# Patient Record
Sex: Female | Born: 1965 | Race: White | Hispanic: No | Marital: Married | Smoking: Never smoker
Health system: Southern US, Community
[De-identification: ages and names within clinical notes are randomized; demographics above are authoritative.]

## PROBLEM LIST (undated history)

## (undated) DIAGNOSIS — D259 Leiomyoma of uterus, unspecified: Secondary | ICD-10-CM

## (undated) DIAGNOSIS — O3410 Maternal care for benign tumor of corpus uteri, unspecified trimester: Secondary | ICD-10-CM

## (undated) DIAGNOSIS — R011 Cardiac murmur, unspecified: Secondary | ICD-10-CM

## (undated) DIAGNOSIS — G43909 Migraine, unspecified, not intractable, without status migrainosus: Secondary | ICD-10-CM

## (undated) HISTORY — PX: ABDOMINAL HYSTERECTOMY: SHX81

## (undated) HISTORY — DX: Leiomyoma of uterus, unspecified: O34.10

## (undated) HISTORY — DX: Migraine, unspecified, not intractable, without status migrainosus: G43.909

## (undated) HISTORY — DX: Leiomyoma of uterus, unspecified: D25.9

## (undated) HISTORY — PX: OTHER SURGICAL HISTORY: SHX169

## (undated) HISTORY — DX: Cardiac murmur, unspecified: R01.1

---

## 2004-05-23 HISTORY — PX: DOPPLER ECHOCARDIOGRAPHY: SHX263

## 2005-08-02 ENCOUNTER — Ambulatory Visit: Payer: Self-pay

## 2008-07-28 ENCOUNTER — Ambulatory Visit: Payer: Self-pay

## 2008-09-05 ENCOUNTER — Ambulatory Visit: Payer: Self-pay | Admitting: Obstetrics and Gynecology

## 2008-09-09 ENCOUNTER — Ambulatory Visit: Payer: Self-pay | Admitting: Obstetrics and Gynecology

## 2008-12-26 ENCOUNTER — Ambulatory Visit: Payer: Self-pay | Admitting: Obstetrics and Gynecology

## 2009-01-13 ENCOUNTER — Ambulatory Visit: Payer: Self-pay | Admitting: Obstetrics and Gynecology

## 2010-04-14 ENCOUNTER — Ambulatory Visit: Payer: Self-pay | Admitting: Obstetrics and Gynecology

## 2011-08-11 ENCOUNTER — Ambulatory Visit: Payer: Self-pay | Admitting: Family Medicine

## 2012-09-11 LAB — HM PAP SMEAR

## 2012-09-27 ENCOUNTER — Ambulatory Visit: Payer: Self-pay | Admitting: Family Medicine

## 2014-01-20 ENCOUNTER — Ambulatory Visit: Payer: Self-pay | Admitting: Family Medicine

## 2014-01-20 LAB — HM MAMMOGRAPHY

## 2015-03-06 ENCOUNTER — Other Ambulatory Visit: Payer: Self-pay | Admitting: Family Medicine

## 2015-03-06 DIAGNOSIS — Z1231 Encounter for screening mammogram for malignant neoplasm of breast: Secondary | ICD-10-CM

## 2015-03-09 ENCOUNTER — Ambulatory Visit: Payer: Self-pay

## 2015-03-16 ENCOUNTER — Ambulatory Visit
Admission: RE | Admit: 2015-03-16 | Discharge: 2015-03-16 | Disposition: A | Discharge status: Home / Self Care | Payer: BC Managed Care – PPO | Source: Ambulatory Visit | Attending: Family Medicine | Admitting: Family Medicine

## 2015-03-16 DIAGNOSIS — Z1231 Encounter for screening mammogram for malignant neoplasm of breast: Secondary | ICD-10-CM | POA: Insufficient documentation

## 2015-03-17 DIAGNOSIS — G43909 Migraine, unspecified, not intractable, without status migrainosus: Secondary | ICD-10-CM | POA: Insufficient documentation

## 2015-03-17 DIAGNOSIS — F321 Major depressive disorder, single episode, moderate: Secondary | ICD-10-CM

## 2015-03-17 DIAGNOSIS — R011 Cardiac murmur, unspecified: Secondary | ICD-10-CM | POA: Insufficient documentation

## 2015-03-19 ENCOUNTER — Encounter: Payer: Self-pay | Admitting: Family Medicine

## 2015-03-19 ENCOUNTER — Ambulatory Visit (INDEPENDENT_AMBULATORY_CARE_PROVIDER_SITE_OTHER): Payer: BC Managed Care – PPO | Admitting: Family Medicine

## 2015-03-19 VITALS — BP 117/78 | HR 57 | Temp 97.8°F | Ht 63.0 in | Wt 147.2 lb

## 2015-03-19 DIAGNOSIS — Z23 Encounter for immunization: Secondary | ICD-10-CM | POA: Diagnosis not present

## 2015-03-19 DIAGNOSIS — Z0000 Encounter for general adult medical examination without abnormal findings: Secondary | ICD-10-CM

## 2015-03-19 DIAGNOSIS — Z90711 Acquired absence of uterus with remaining cervical stump: Secondary | ICD-10-CM | POA: Diagnosis not present

## 2015-03-19 LAB — URINALYSIS, ROUTINE W REFLEX MICROSCOPIC
BILIRUBIN UA: NEGATIVE
Glucose, UA: NEGATIVE
KETONES UA: NEGATIVE
Leukocytes, UA: NEGATIVE
Nitrite, UA: NEGATIVE
PH UA: 7 (ref 5.0–7.5)
Protein, UA: NEGATIVE
SPEC GRAV UA: 1.015 (ref 1.005–1.030)
UUROB: 0.2 mg/dL (ref 0.2–1.0)

## 2015-03-19 LAB — MICROSCOPIC EXAMINATION
RBC, UA: NONE SEEN /hpf (ref 0–?)
WBC UA: NONE SEEN /HPF (ref 0–?)

## 2015-03-19 NOTE — Progress Notes (Signed)
BP 117/78 mmHg  Pulse 57  Temp(Src) 97.8 F (36.6 C)  Ht 5\' 3"  (1.6 m)  Wt 147 lb 3.2 oz (66.769 kg)  BMI 26.08 kg/m2  SpO2 99%   Subjective:    Patient ID: Cheryl Dawson, female    DOB: 06-20-1965, 49 y.o.   MRN: [id number]  HPI: Cheryl Dawson is a 49 y.o. female  Chief Complaint  Patient presents with  . 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 Interim medical history since our last visit reviewed. Allergies and medications reviewed and updated.  Review of Systems  Constitutional: Negative.   HENT: Negative.   Eyes: Negative.   Respiratory: Negative.   Cardiovascular: Negative.   Gastrointestinal: Negative.   Endocrine: Negative.   Genitourinary: Negative.   Musculoskeletal: Negative.   Skin: Negative.   Allergic/Immunologic: Negative.   Neurological: Negative.   Hematological: Negative.   Psychiatric/Behavioral: Negative.     Per HPI unless specifically indicated above     Objective:    BP 117/78 mmHg  Pulse 57  Temp(Src) 97.8 F (36.6 C)  Ht 5\' 3"  (1.6 m)  Wt 147 lb 3.2 oz (66.769 kg)  BMI 26.08 kg/m2  SpO2 99%  Wt Readings from Last 3 Encounters:  03/19/15 147 lb 3.2 oz (66.769 kg)  01/17/14 148 lb (67.132 kg)    Physical Exam  Constitutional: She is oriented to person, place, and time. She appears well-developed and well-nourished. No distress.  HENT:  Head: Normocephalic and atraumatic.  Right Ear: Hearing and external ear normal.  Left Ear: Hearing and external ear normal.  Nose: Nose normal.  Mouth/Throat: Oropharynx is clear and moist.  Eyes: Conjunctivae, EOM and lids are normal. Pupils are equal, round, and reactive to light. Right eye exhibits no discharge. Left eye exhibits no discharge. No scleral icterus.   Neck: Normal range of motion. Neck supple. Carotid bruit is not present.  Cardiovascular: Normal rate, regular rhythm and normal heart sounds.   No murmur heard. Pulmonary/Chest: Effort normal and breath sounds normal. No respiratory distress. She exhibits no mass. Right breast exhibits no mass, no skin change and no tenderness. Left breast exhibits no mass, no skin change and no tenderness. Breasts are symmetrical.  Abdominal: Soft. Bowel sounds are normal. There is no hepatosplenomegaly.  Musculoskeletal: Normal range of motion.  Neurological: She is alert and oriented to person, place, and time.  Skin: Skin is intact. No rash noted.  Developing Dupuytren's contractures  Psychiatric: She has a normal mood and affect. Her speech is normal and behavior is normal. Judgment and thought content normal. Cognition and memory are normal.    Results for orders placed or performed in visit on 03/17/15  HM MAMMOGRAPHY  Result Value Ref Range   HM Mammogram from PP   HM PAP SMEAR  Result Value Ref Range   HM Pap smear from PP       Assessment & Plan:   Problem List Items Addressed This Visit      Other   S/P partial hysterectomy    Other Visit Diagnoses    Routine general medical examination at a health care facility    -  Primary    Relevant Orders    CBC with Differential/Platelet    Comprehensive metabolic panel  Lipid Panel w/o Chol/HDL Ratio    TSH    Urinalysis, Routine w reflex microscopic (not at [facility])    Immunization due        Relevant Orders    Flu Vaccine QUAD 36+ mos PF IM (Fluarix & Fluzone Quad PF) (Completed)        Follow up plan: Return if symptoms worsen or fail to improve, for Physical Exam.

## 2015-03-20 LAB — CBC WITH DIFFERENTIAL/PLATELET
BASOS ABS: 0 10*3/uL (ref 0.0–0.2)
Basos: 1 %
EOS (ABSOLUTE): 0.1 10*3/uL (ref 0.0–0.4)
EOS: 1 %
HEMATOCRIT: 41.7 % (ref 34.0–46.6)
HEMOGLOBIN: 14 g/dL (ref 11.1–15.9)
IMMATURE GRANS (ABS): 0 10*3/uL (ref 0.0–0.1)
Immature Granulocytes: 0 %
LYMPHS: 44 %
Lymphocytes Absolute: 1.9 10*3/uL (ref 0.7–3.1)
MCH: 28.9 pg (ref 26.6–33.0)
MCHC: 33.6 g/dL (ref 31.5–35.7)
MCV: 86 fL (ref 79–97)
MONOCYTES: 7 %
Monocytes Absolute: 0.3 10*3/uL (ref 0.1–0.9)
NEUTROS ABS: 2 10*3/uL (ref 1.4–7.0)
Neutrophils: 47 %
Platelets: 248 10*3/uL (ref 150–379)
RBC: 4.85 x10E6/uL (ref 3.77–5.28)
RDW: 13.5 % (ref 12.3–15.4)
WBC: 4.3 10*3/uL (ref 3.4–10.8)

## 2015-03-20 LAB — LIPID PANEL W/O CHOL/HDL RATIO
Cholesterol, Total: 129 mg/dL (ref 100–199)
HDL: 55 mg/dL (ref >39)
LDL CALC: 62 mg/dL (ref 0–99)
Triglycerides: 58 mg/dL (ref 0–149)
VLDL CHOLESTEROL CAL: 12 mg/dL (ref 5–40)

## 2015-03-20 LAB — COMPREHENSIVE METABOLIC PANEL
ALT: 13 IU/L (ref 0–32)
AST: 12 IU/L (ref 0–40)
Albumin/Globulin Ratio: 1.9 (ref 1.1–2.5)
Albumin: 4.4 g/dL (ref 3.5–5.5)
Alkaline Phosphatase: 55 IU/L (ref 39–117)
BUN / CREAT RATIO: 19 (ref 9–23)
BUN: 14 mg/dL (ref 6–24)
Bilirubin Total: 0.3 mg/dL (ref 0.0–1.2)
CO2: 27 mmol/L (ref 18–29)
CREATININE: 0.74 mg/dL (ref 0.57–1.00)
Calcium: 9.4 mg/dL (ref 8.7–10.2)
Chloride: 100 mmol/L (ref 97–106)
GFR calc non Af Amer: 97 mL/min/{1.73_m2} (ref >59)
GFR, EST AFRICAN AMERICAN: 112 mL/min/{1.73_m2} (ref >59)
GLOBULIN, TOTAL: 2.3 g/dL (ref 1.5–4.5)
GLUCOSE: 86 mg/dL (ref 65–99)
Potassium: 4.4 mmol/L (ref 3.5–5.2)
Sodium: 138 mmol/L (ref 136–144)
TOTAL PROTEIN: 6.7 g/dL (ref 6.0–8.5)

## 2015-03-20 LAB — TSH: TSH: 1.47 u[IU]/mL (ref 0.450–4.500)

## 2015-03-23 ENCOUNTER — Encounter: Payer: Self-pay | Admitting: Family Medicine

## 2015-07-07 ENCOUNTER — Encounter: Payer: Self-pay | Admitting: Family Medicine

## 2016-02-22 ENCOUNTER — Encounter (INDEPENDENT_AMBULATORY_CARE_PROVIDER_SITE_OTHER): Payer: Self-pay

## 2016-02-25 ENCOUNTER — Other Ambulatory Visit: Payer: Self-pay | Admitting: Family Medicine

## 2016-02-25 DIAGNOSIS — Z1231 Encounter for screening mammogram for malignant neoplasm of breast: Secondary | ICD-10-CM

## 2016-03-11 ENCOUNTER — Encounter (INDEPENDENT_AMBULATORY_CARE_PROVIDER_SITE_OTHER): Payer: Self-pay

## 2016-03-21 ENCOUNTER — Encounter: Payer: BC Managed Care – PPO | Admitting: Family Medicine

## 2016-03-23 ENCOUNTER — Ambulatory Visit
Admission: RE | Admit: 2016-03-23 | Discharge: 2016-03-23 | Disposition: A | Discharge status: Home / Self Care | Payer: BC Managed Care – PPO | Source: Ambulatory Visit | Attending: Family Medicine | Admitting: Family Medicine

## 2016-03-23 DIAGNOSIS — Z1231 Encounter for screening mammogram for malignant neoplasm of breast: Secondary | ICD-10-CM | POA: Diagnosis present

## 2016-03-28 ENCOUNTER — Encounter: Payer: Self-pay | Admitting: Family Medicine

## 2016-03-28 ENCOUNTER — Ambulatory Visit (INDEPENDENT_AMBULATORY_CARE_PROVIDER_SITE_OTHER): Payer: BC Managed Care – PPO | Admitting: Family Medicine

## 2016-03-28 VITALS — BP 126/84 | HR 61 | Temp 98.5°F | Wt 145.3 lb

## 2016-03-28 DIAGNOSIS — Z0000 Encounter for general adult medical examination without abnormal findings: Secondary | ICD-10-CM | POA: Diagnosis not present

## 2016-03-28 DIAGNOSIS — Z114 Encounter for screening for human immunodeficiency virus [HIV]: Secondary | ICD-10-CM

## 2016-03-28 LAB — CBC WITH DIFFERENTIAL/PLATELET
HEMATOCRIT: 41.5 % (ref 34.0–46.6)
HEMOGLOBIN: 14.3 g/dL (ref 11.1–15.9)
LYMPHS ABS: 1.9 10*3/uL (ref 0.7–3.1)
Lymphs: 34 %
MCH: 29.9 pg (ref 26.6–33.0)
MCHC: 34.5 g/dL (ref 31.5–35.7)
MCV: 87 fL (ref 79–97)
MID (Absolute): 0.4 10*3/uL (ref 0.1–1.6)
MID: 7 %
NEUTROS ABS: 3.1 10*3/uL (ref 1.4–7.0)
NEUTROS PCT: 58 %
Platelets: 215 10*3/uL (ref 150–379)
RBC: 4.78 x10E6/uL (ref 3.77–5.28)
RDW: 12.8 % (ref 12.3–15.4)
WBC: 5.4 10*3/uL (ref 3.4–10.8)

## 2016-03-28 LAB — URINALYSIS, ROUTINE W REFLEX MICROSCOPIC
BILIRUBIN UA: NEGATIVE
GLUCOSE, UA: NEGATIVE
KETONES UA: NEGATIVE
Leukocytes, UA: NEGATIVE
Nitrite, UA: NEGATIVE
PROTEIN UA: NEGATIVE
RBC, UA: NEGATIVE
Specific Gravity, UA: 1.02 (ref 1.005–1.030)
UUROB: 0.2 mg/dL (ref 0.2–1.0)
pH, UA: 7 (ref 5.0–7.5)

## 2016-03-28 NOTE — Progress Notes (Signed)
BP 126/84 (BP Location: Left Arm, Patient Position: Sitting, Cuff Size: Normal)   Pulse 61   Temp 98.5 F (36.9 C)   Wt 145 lb 4.8 oz (65.9 kg)   SpO2 99%   BMI 25.74 kg/m    Subjective:    Patient ID: Cheryl Dawson, female    DOB: [birth year], 50 y.o.   MRN: [id number]  HPI: Cheryl Dawson is a 50 y.o. female  Chief Complaint  Patient presents with  . Annual Exam  . Other    Please see PHQ9 score    Patient follow-up physical is complaining of fatigue but works 2 jobs with a lot of financial stress and has limited sleep. Otherwise doing okay nerves okay but has some occasional bad days  Relevant past medical, surgical, family and social history reviewed and updated as indicated. Interim medical history since our last visit reviewed. Allergies and medications reviewed and updated.  Review of Systems  Constitutional: Negative.   HENT: Negative.   Eyes: Negative.   Respiratory: Negative.   Cardiovascular: Negative.   Gastrointestinal: Negative.   Endocrine: Negative.   Genitourinary: Negative.   Musculoskeletal: Negative.   Skin: Negative.   Allergic/Immunologic: Negative.   Neurological: Negative.   Hematological: Negative.   Psychiatric/Behavioral: Negative.     Per HPI unless specifically indicated above     Objective:    BP 126/84 (BP Location: Left Arm, Patient Position: Sitting, Cuff Size: Normal)   Pulse 61   Temp 98.5 F (36.9 C)   Wt 145 lb 4.8 oz (65.9 kg)   SpO2 99%   BMI 25.74 kg/m   Wt Readings from Last 3 Encounters:  03/28/16 145 lb 4.8 oz (65.9 kg)  03/19/15 147 lb 3.2 oz (66.8 kg)  01/17/14 148 lb (67.1 kg)    Physical Exam  Constitutional: She is oriented to person, place, and time. She appears well-developed and well-nourished.  HENT:  Head: Normocephalic and atraumatic.  Right Ear: External ear normal.  Left Ear: External ear normal.  Nose: Nose normal.  Mouth/Throat: Oropharynx is clear and moist.  Eyes: Conjunctivae and  EOM are normal. Pupils are equal, round, and reactive to light.  Neck: Normal range of motion. Neck supple. Carotid bruit is not present.  Cardiovascular: Normal rate, regular rhythm and normal heart sounds.   No murmur heard. Pulmonary/Chest: Effort normal and breath sounds normal. She exhibits no mass. Right breast exhibits no mass, no skin change and no tenderness. Left breast exhibits no mass, no skin change and no tenderness. Breasts are symmetrical.  Abdominal: Soft. Bowel sounds are normal. There is no hepatosplenomegaly.  Musculoskeletal: Normal range of motion.  Neurological: She is alert and oriented to person, place, and time.  Skin: No rash noted.  Psychiatric: She has a normal mood and affect. Her behavior is normal. Judgment and thought content normal.    Results for orders placed or performed in visit on 03/19/15  Microscopic Examination  Result Value Ref Range   WBC, UA None seen 0 - 5 /hpf   RBC, UA None seen 0 - 2 /hpf   Epithelial Cells (non renal) 0-10 0 - 10 /hpf  CBC with Differential/Platelet  Result Value Ref Range   WBC 4.3 3.4 - 10.8 x10E3/uL   RBC 4.85 3.77 - 5.28 x10E6/uL   Hemoglobin 14.0 11.1 - 15.9 g/dL   Hematocrit 41.7 34.0 - 46.6 %   MCV 86 79 - 97 fL   MCH 28.9 26.6 - 33.0 pg  MCHC 33.6 31.5 - 35.7 g/dL   RDW 13.5 12.3 - 15.4 %   Platelets 248 150 - 379 x10E3/uL   Neutrophils 47 %   Lymphs 44 %   Monocytes 7 %   Eos 1 %   Basos 1 %   Neutrophils Absolute 2.0 1.4 - 7.0 x10E3/uL   Lymphocytes Absolute 1.9 0.7 - 3.1 x10E3/uL   Monocytes Absolute 0.3 0.1 - 0.9 x10E3/uL   EOS (ABSOLUTE) 0.1 0.0 - 0.4 x10E3/uL   Basophils Absolute 0.0 0.0 - 0.2 x10E3/uL   Immature Granulocytes 0 %   Immature Grans (Abs) 0.0 0.0 - 0.1 x10E3/uL  Comprehensive metabolic panel  Result Value Ref Range   Glucose 86 65 - 99 mg/dL   BUN 14 6 - 24 mg/dL   Creatinine, Ser 0.74 0.57 - 1.00 mg/dL   GFR calc non Af Amer 97 >59 mL/min/1.73   GFR calc Af Amer 112 >59  mL/min/1.73   BUN/Creatinine Ratio 19 9 - 23   Sodium 138 136 - 144 mmol/L   Potassium 4.4 3.5 - 5.2 mmol/L   Chloride 100 97 - 106 mmol/L   CO2 27 18 - 29 mmol/L   Calcium 9.4 8.7 - 10.2 mg/dL   Total Protein 6.7 6.0 - 8.5 g/dL   Albumin 4.4 3.5 - 5.5 g/dL   Globulin, Total 2.3 1.5 - 4.5 g/dL   Albumin/Globulin Ratio 1.9 1.1 - 2.5   Bilirubin Total 0.3 0.0 - 1.2 mg/dL   Alkaline Phosphatase 55 39 - 117 IU/L   AST 12 0 - 40 IU/L   ALT 13 0 - 32 IU/L  Lipid Panel w/o Chol/HDL Ratio  Result Value Ref Range   Cholesterol, Total 129 100 - 199 mg/dL   Triglycerides 58 0 - 149 mg/dL   HDL 55 >39 mg/dL   VLDL Cholesterol Cal 12 5 - 40 mg/dL   LDL Calculated 62 0 - 99 mg/dL  TSH  Result Value Ref Range   TSH 1.470 0.450 - 4.500 uIU/mL  Urinalysis, Routine w reflex microscopic (not at [facility])  Result Value Ref Range   Specific Gravity, UA 1.015 1.005 - 1.030   pH, UA 7.0 5.0 - 7.5   Color, UA Yellow Yellow   Appearance Ur Clear Clear   Leukocytes, UA Negative Negative   Protein, UA Negative Negative/Trace   Glucose, UA Negative Negative   Ketones, UA Negative Negative   RBC, UA Trace (A) Negative   Bilirubin, UA Negative Negative   Urobilinogen, Ur 0.2 0.2 - 1.0 mg/dL   Nitrite, UA Negative Negative   Microscopic Examination See below:       Assessment & Plan:   Problem List Items Addressed This Visit    None    Visit Diagnoses    Annual physical exam    -  Primary   Relevant Orders   Comprehensive metabolic panel   Lipid Panel w/o Chol/HDL Ratio   TSH   Urinalysis, Routine w reflex microscopic (not at [facility])   CBC With Differential/Platelet   Screening for HIV (human immunodeficiency virus)       Relevant Orders   HIV antibody      Discuss lifestyle stress self-care Follow up plan: Return in about 1 year (around 03/28/2017) for Physical Exam.

## 2016-03-29 ENCOUNTER — Encounter: Payer: Self-pay | Admitting: Family Medicine

## 2016-03-29 LAB — COMPREHENSIVE METABOLIC PANEL
A/G RATIO: 2 (ref 1.2–2.2)
ALT: 11 IU/L (ref 0–32)
AST: 11 IU/L (ref 0–40)
Albumin: 4.5 g/dL (ref 3.5–5.5)
Alkaline Phosphatase: 58 IU/L (ref 39–117)
BUN/Creatinine Ratio: 18 (ref 9–23)
BUN: 15 mg/dL (ref 6–24)
Bilirubin Total: 0.3 mg/dL (ref 0.0–1.2)
CALCIUM: 9.3 mg/dL (ref 8.7–10.2)
CO2: 25 mmol/L (ref 18–29)
CREATININE: 0.85 mg/dL (ref 0.57–1.00)
Chloride: 100 mmol/L (ref 96–106)
GFR, EST AFRICAN AMERICAN: 94 mL/min/{1.73_m2} (ref >59)
GFR, EST NON AFRICAN AMERICAN: 81 mL/min/{1.73_m2} (ref >59)
GLOBULIN, TOTAL: 2.2 g/dL (ref 1.5–4.5)
Glucose: 90 mg/dL (ref 65–99)
Potassium: 4.2 mmol/L (ref 3.5–5.2)
SODIUM: 140 mmol/L (ref 134–144)
TOTAL PROTEIN: 6.7 g/dL (ref 6.0–8.5)

## 2016-03-29 LAB — LIPID PANEL W/O CHOL/HDL RATIO
Cholesterol, Total: 142 mg/dL (ref 100–199)
HDL: 51 mg/dL (ref >39)
LDL CALC: 70 mg/dL (ref 0–99)
TRIGLYCERIDES: 106 mg/dL (ref 0–149)
VLDL Cholesterol Cal: 21 mg/dL (ref 5–40)

## 2016-03-29 LAB — TSH: TSH: 2.33 u[IU]/mL (ref 0.450–4.500)

## 2016-03-29 LAB — HIV ANTIBODY (ROUTINE TESTING W REFLEX): HIV Screen 4th Generation wRfx: NONREACTIVE

## 2016-12-05 ENCOUNTER — Encounter: Payer: Self-pay | Admitting: Family Medicine

## 2017-03-27 ENCOUNTER — Other Ambulatory Visit: Payer: Self-pay | Admitting: Family Medicine

## 2017-03-27 DIAGNOSIS — Z1231 Encounter for screening mammogram for malignant neoplasm of breast: Secondary | ICD-10-CM

## 2017-03-29 ENCOUNTER — Encounter: Payer: BC Managed Care – PPO | Admitting: Family Medicine

## 2017-04-03 ENCOUNTER — Encounter: Payer: Self-pay | Admitting: Family Medicine

## 2017-04-03 ENCOUNTER — Ambulatory Visit: Payer: BC Managed Care – PPO | Admitting: Family Medicine

## 2017-04-03 VITALS — BP 124/83 | HR 62 | Temp 98.4°F | Ht 63.5 in | Wt 144.6 lb

## 2017-04-03 DIAGNOSIS — R011 Cardiac murmur, unspecified: Secondary | ICD-10-CM

## 2017-04-03 DIAGNOSIS — Z0000 Encounter for general adult medical examination without abnormal findings: Secondary | ICD-10-CM | POA: Diagnosis not present

## 2017-04-03 DIAGNOSIS — R51 Headache: Secondary | ICD-10-CM

## 2017-04-03 DIAGNOSIS — R519 Headache, unspecified: Secondary | ICD-10-CM | POA: Insufficient documentation

## 2017-04-03 DIAGNOSIS — Z1329 Encounter for screening for other suspected endocrine disorder: Secondary | ICD-10-CM

## 2017-04-03 DIAGNOSIS — Z131 Encounter for screening for diabetes mellitus: Secondary | ICD-10-CM

## 2017-04-03 DIAGNOSIS — Z13220 Encounter for screening for lipoid disorders: Secondary | ICD-10-CM

## 2017-04-03 DIAGNOSIS — G44229 Chronic tension-type headache, not intractable: Secondary | ICD-10-CM

## 2017-04-03 LAB — URINALYSIS, ROUTINE W REFLEX MICROSCOPIC
Bilirubin, UA: NEGATIVE
Glucose, UA: NEGATIVE
KETONES UA: NEGATIVE
LEUKOCYTES UA: NEGATIVE
NITRITE UA: NEGATIVE
PROTEIN UA: NEGATIVE
RBC UA: NEGATIVE
SPEC GRAV UA: 1.015 (ref 1.005–1.030)
Urobilinogen, Ur: 0.2 mg/dL (ref 0.2–1.0)
pH, UA: 8.5 — ABNORMAL HIGH (ref 5.0–7.5)

## 2017-04-03 NOTE — Assessment & Plan Note (Signed)
atient with headaches possibly related to tension and stress from work but with family history of brain aneurysm in both grandmother and mother will evaluate with MR angiography.

## 2017-04-03 NOTE — Progress Notes (Signed)
BP 124/83 (BP Location: Left Arm, Patient Position: Sitting, Cuff Size: Normal)   Pulse 62   Temp 98.4 F (36.9 C) (Temporal)   Ht 5' 3.5" (1.613 m)   Wt 144 lb 9.6 oz (65.6 kg)   SpO2 99%   BMI 25.21 kg/m    Subjective:    Patient ID: Cheryl Dawson, female    DOB: 06/02/1965, 51 y.o.   MRN: [id number]  HPI: Cheryl Dawson is a 51 y.o. female  Chief Complaint  Patient presents with  . Annual Exam  . Migraine  . Fatigue  patient with history of headaches. Headaches seem to come on more at work posterior neck area up into back of scalp. Does have some  Visual symptoms that seem to be associated with her headaches. Does not sound like classic migraine visual symptoms. Seem to happen about twice a week. Patient does multiple jobs working in [employer] at work and they're short staffed and there is a lot of tension. Tylenol seems to work well as does rest and sleep. Patient also relates mother had a brain aneurysm. No other history is available..   Relevant past medical, surgical, family and social history reviewed and updated as indicated. Interim medical history since our last visit reviewed. Allergies and medications reviewed and updated.  Review of Systems  Constitutional: Negative.   HENT: Negative.   Eyes: Negative.   Respiratory: Negative.   Cardiovascular: Negative.   Gastrointestinal: Negative.   Endocrine: Negative.   Genitourinary: Negative.   Musculoskeletal: Negative.   Skin: Negative.   Allergic/Immunologic: Negative.   Neurological: Negative.   Hematological: Negative.   Psychiatric/Behavioral: Negative.     Per HPI unless specifically indicated above     Objective:    BP 124/83 (BP Location: Left Arm, Patient Position: Sitting, Cuff Size: Normal)   Pulse 62   Temp 98.4 F (36.9 C) (Temporal)   Ht 5' 3.5" (1.613 m)   Wt 144 lb 9.6 oz (65.6 kg)   SpO2 99%   BMI 25.21 kg/m   Wt Readings from Last 3 Encounters:  04/03/17 144 lb 9.6 oz (65.6 kg)   03/28/16 145 lb 4.8 oz (65.9 kg)  03/19/15 147 lb 3.2 oz (66.8 kg)    Physical Exam  Constitutional: She is oriented to person, place, and time. She appears well-developed and well-nourished.  HENT:  Head: Normocephalic and atraumatic.  Right Ear: External ear normal.  Left Ear: External ear normal.  Nose: Nose normal.  Mouth/Throat: Oropharynx is clear and moist.  Eyes: Conjunctivae and EOM are normal. Pupils are equal, round, and reactive to light.  Neck: Normal range of motion. Neck supple. Carotid bruit is not present.  Cardiovascular: Normal rate, regular rhythm and normal heart sounds.  No murmur heard. Pulmonary/Chest: Effort normal and breath sounds normal. She exhibits no mass. Right breast exhibits no mass, no skin change and no tenderness. Left breast exhibits no mass, no skin change and no tenderness. Breasts are symmetrical.  Abdominal: Soft. Bowel sounds are normal. There is no hepatosplenomegaly.  Musculoskeletal: Normal range of motion.  Neurological: She is alert and oriented to person, place, and time.  Skin: No rash noted.  Psychiatric: She has a normal mood and affect. Her behavior is normal. Judgment and thought content normal.    Results for orders placed or performed in visit on 03/28/16  HIV antibody  Result Value Ref Range   HIV Screen 4th Generation wRfx Non Reactive Non Reactive  Comprehensive metabolic panel  Result Value Ref Range   Glucose 90 65 - 99 mg/dL   BUN 15 6 - 24 mg/dL   Creatinine, Ser 0.85 0.57 - 1.00 mg/dL   GFR calc non Af Amer 81 >59 mL/min/1.73   GFR calc Af Amer 94 >59 mL/min/1.73   BUN/Creatinine Ratio 18 9 - 23   Sodium 140 134 - 144 mmol/L   Potassium 4.2 3.5 - 5.2 mmol/L   Chloride 100 96 - 106 mmol/L   CO2 25 18 - 29 mmol/L   Calcium 9.3 8.7 - 10.2 mg/dL   Total Protein 6.7 6.0 - 8.5 g/dL   Albumin 4.5 3.5 - 5.5 g/dL   Globulin, Total 2.2 1.5 - 4.5 g/dL   Albumin/Globulin Ratio 2.0 1.2 - 2.2   Bilirubin Total 0.3 0.0 -  1.2 mg/dL   Alkaline Phosphatase 58 39 - 117 IU/L   AST 11 0 - 40 IU/L   ALT 11 0 - 32 IU/L  Lipid Panel w/o Chol/HDL Ratio  Result Value Ref Range   Cholesterol, Total 142 100 - 199 mg/dL   Triglycerides 106 0 - 149 mg/dL   HDL 51 >39 mg/dL   VLDL Cholesterol Cal 21 5 - 40 mg/dL   LDL Calculated 70 0 - 99 mg/dL  TSH  Result Value Ref Range   TSH 2.330 0.450 - 4.500 uIU/mL  Urinalysis, Routine w reflex microscopic (not at [facility])  Result Value Ref Range   Specific Gravity, UA 1.020 1.005 - 1.030   pH, UA 7.0 5.0 - 7.5   Color, UA Yellow Yellow   Appearance Ur Cloudy (A) Clear   Leukocytes, UA Negative Negative   Protein, UA Negative Negative/Trace   Glucose, UA Negative Negative   Ketones, UA Negative Negative   RBC, UA Negative Negative   Bilirubin, UA Negative Negative   Urobilinogen, Ur 0.2 0.2 - 1.0 mg/dL   Nitrite, UA Negative Negative  CBC With Differential/Platelet  Result Value Ref Range   WBC 5.4 3.4 - 10.8 x10E3/uL   RBC 4.78 3.77 - 5.28 x10E6/uL   Hemoglobin 14.3 11.1 - 15.9 g/dL   Hematocrit 41.5 34.0 - 46.6 %   MCV 87 79 - 97 fL   MCH 29.9 26.6 - 33.0 pg   MCHC 34.5 31.5 - 35.7 g/dL   RDW 12.8 12.3 - 15.4 %   Platelets 215 150 - 379 x10E3/uL   Neutrophils 58 Not Estab. %   Lymphs 34 Not Estab. %   MID 7 Not Estab. %   Neutrophils Absolute 3.1 1.4 - 7.0 x10E3/uL   Lymphocytes Absolute 1.9 0.7 - 3.1 x10E3/uL   MID (Absolute) 0.4 0.1 - 1.6 X10E3/uL      Assessment & Plan:   Problem List Items Addressed This Visit      Other   Undiagnosed cardiac murmurs    Followed by cardiology and insignificant murmur.      Headache disorder    atient with headaches possibly related to tension and stress from work but with family history of brain aneurysm in both grandmother and mother will evaluate with MR angiography.       Other Visit Diagnoses    Annual physical exam    -  Primary   Needs flu shot       Screening for diabetes mellitus (DM)       Thyroid  disorder screen       Need for lipid screening       Chronic tension-type headache, not intractable  Follow up plan: Return in about 1 year (around 04/03/2018), or if symptoms worsen or fail to improve, for Physical Exam.

## 2017-04-03 NOTE — Assessment & Plan Note (Signed)
Followed by cardiology and insignificant murmur.

## 2017-04-04 ENCOUNTER — Encounter: Payer: Self-pay | Admitting: Family Medicine

## 2017-04-04 LAB — COMPREHENSIVE METABOLIC PANEL
ALBUMIN: 4.6 g/dL (ref 3.5–5.5)
ALT: 12 IU/L (ref 0–32)
AST: 12 IU/L (ref 0–40)
Albumin/Globulin Ratio: 2 (ref 1.2–2.2)
Alkaline Phosphatase: 57 IU/L (ref 39–117)
BILIRUBIN TOTAL: 0.3 mg/dL (ref 0.0–1.2)
BUN / CREAT RATIO: 14 (ref 9–23)
BUN: 12 mg/dL (ref 6–24)
CHLORIDE: 103 mmol/L (ref 96–106)
CO2: 25 mmol/L (ref 20–29)
Calcium: 9.3 mg/dL (ref 8.7–10.2)
Creatinine, Ser: 0.86 mg/dL (ref 0.57–1.00)
GFR calc non Af Amer: 80 mL/min/{1.73_m2} (ref >59)
GFR, EST AFRICAN AMERICAN: 92 mL/min/{1.73_m2} (ref >59)
GLOBULIN, TOTAL: 2.3 g/dL (ref 1.5–4.5)
Glucose: 93 mg/dL (ref 65–99)
Potassium: 4.2 mmol/L (ref 3.5–5.2)
Sodium: 141 mmol/L (ref 134–144)
Total Protein: 6.9 g/dL (ref 6.0–8.5)

## 2017-04-04 LAB — CBC WITH DIFFERENTIAL/PLATELET
BASOS ABS: 0 10*3/uL (ref 0.0–0.2)
BASOS: 1 %
EOS (ABSOLUTE): 0.1 10*3/uL (ref 0.0–0.4)
Eos: 1 %
Hematocrit: 41.6 % (ref 34.0–46.6)
Hemoglobin: 14 g/dL (ref 11.1–15.9)
IMMATURE GRANS (ABS): 0 10*3/uL (ref 0.0–0.1)
IMMATURE GRANULOCYTES: 0 %
LYMPHS: 41 %
Lymphocytes Absolute: 1.8 10*3/uL (ref 0.7–3.1)
MCH: 29 pg (ref 26.6–33.0)
MCHC: 33.7 g/dL (ref 31.5–35.7)
MCV: 86 fL (ref 79–97)
MONOCYTES: 8 %
Monocytes Absolute: 0.3 10*3/uL (ref 0.1–0.9)
NEUTROS PCT: 49 %
Neutrophils Absolute: 2.2 10*3/uL (ref 1.4–7.0)
PLATELETS: 257 10*3/uL (ref 150–379)
RBC: 4.83 x10E6/uL (ref 3.77–5.28)
RDW: 13 % (ref 12.3–15.4)
WBC: 4.5 10*3/uL (ref 3.4–10.8)

## 2017-04-04 LAB — TSH: TSH: 3.23 u[IU]/mL (ref 0.450–4.500)

## 2017-04-04 LAB — LIPID PANEL
CHOLESTEROL TOTAL: 134 mg/dL (ref 100–199)
Chol/HDL Ratio: 2.6 ratio (ref 0.0–4.4)
HDL: 51 mg/dL (ref >39)
LDL Calculated: 67 mg/dL (ref 0–99)
Triglycerides: 78 mg/dL (ref 0–149)
VLDL CHOLESTEROL CAL: 16 mg/dL (ref 5–40)

## 2017-04-10 ENCOUNTER — Telehealth: Payer: Self-pay | Admitting: Family Medicine

## 2017-04-10 DIAGNOSIS — R51 Headache: Principal | ICD-10-CM

## 2017-04-10 DIAGNOSIS — R519 Headache, unspecified: Secondary | ICD-10-CM

## 2017-04-10 NOTE — Telephone Encounter (Signed)
Call pt 

## 2017-04-10 NOTE — Telephone Encounter (Signed)
Copied from [place] #[id number]. >> Apr 10, 2017  3:15 PM Neva Seat wrote: Dr Jeananne Rama requested an MRI for pt.  Cost $1,500 out of pocket.  Pt cannot pay this.  Is there something else can be done for Pt that doesn't cost this much?  Please call pt back ASAP

## 2017-04-10 NOTE — Telephone Encounter (Signed)
Copied from [place] [id number]. Topic: Inquiry >> Apr 10, 2017  3:15 PM Neva Seat wrote: Dr Jeananne Rama requested an MRI for pt.  Cost $1,500 out of pocket.  Pt cannot pay this.  Is there something else can be done for Pt that doesn't cost this much?  Please call pt back ASAP

## 2017-04-11 NOTE — Telephone Encounter (Signed)
Spoke with patient. The problem is I got the authorization with her insurance company with [facility]. I can't go in and change the facility. I would have to obtain a new authorization and the odds of insurance approving another one go down, due to in network/out of network facilities. I would have to call [employer] delete one authorization, obtain a new one, and wait to see if they'll approve it. I cannot try to obtain a new one without deleting or it falling out of the expiration date for the authorization.  Patient understood, she stated that she didn't have $1,500 upfront to pay.  Sometimes you can pay 1/2 and set up a payment plan.   Patient stated she would like to speak to [facility] about it still because patient stated that he would refer her to a neurologist. Routing to provider as Juluis Rainier and CMA to call patient later.

## 2017-04-11 NOTE — Telephone Encounter (Signed)
Patient was transferred to provider for telephone conversation.   

## 2017-04-11 NOTE — Telephone Encounter (Signed)
Phone call Discussed with Dr. Brigitte Pulse neurology of [facility]. He indicates a  MRI and MRA as indicated for family history of brain aneurysm and patient with headaches.He does suggest that a CT angiography may be less expensive and would have the same diagnostic value. We will explore getting CT angiography ordered and approved.Patient will explore the cost.

## 2017-04-11 NOTE — Telephone Encounter (Signed)
Call pt 

## 2017-04-12 ENCOUNTER — Ambulatory Visit: Admission: RE | Admit: 2017-04-12 | Payer: BC Managed Care – PPO | Source: Ambulatory Visit

## 2017-04-20 ENCOUNTER — Ambulatory Visit
Admission: RE | Admit: 2017-04-20 | Discharge: 2017-04-20 | Disposition: A | Discharge status: Home / Self Care | Payer: BC Managed Care – PPO | Source: Ambulatory Visit | Attending: Family Medicine | Admitting: Family Medicine

## 2017-04-20 DIAGNOSIS — Z1231 Encounter for screening mammogram for malignant neoplasm of breast: Secondary | ICD-10-CM | POA: Diagnosis present

## 2017-04-27 ENCOUNTER — Ambulatory Visit
Admission: RE | Admit: 2017-04-27 | Discharge: 2017-04-27 | Disposition: A | Discharge status: Home / Self Care | Payer: BC Managed Care – PPO | Source: Ambulatory Visit | Attending: Family Medicine | Admitting: Family Medicine

## 2017-04-27 ENCOUNTER — Telehealth: Payer: Self-pay | Admitting: Family Medicine

## 2017-04-27 DIAGNOSIS — R51 Headache: Secondary | ICD-10-CM | POA: Insufficient documentation

## 2017-04-27 DIAGNOSIS — R519 Headache, unspecified: Secondary | ICD-10-CM

## 2017-04-27 MED ORDER — SUMATRIPTAN SUCCINATE 100 MG PO TABS: 100.0000 mg | ORAL_TABLET | ORAL | 12 refills | Status: DC | PRN

## 2017-04-27 MED ORDER — IOPAMIDOL (ISOVUE-370) INJECTION 76%
75.0000 mL | Freq: Once | INTRAVENOUS | Status: AC | PRN
  Administered 2017-04-27: 75 mL via INTRAVENOUS

## 2017-04-27 NOTE — Telephone Encounter (Signed)
Phone call Discussed with patient normal CT scan still having headaches will call in Imitrex

## 2017-05-11 ENCOUNTER — Telehealth: Payer: Self-pay | Admitting: Family Medicine

## 2017-05-11 NOTE — Telephone Encounter (Signed)
Copied from [place] #[id number]. Topic: Quick Communication - See Telephone Encounter >> May 11, 2017  8:38 AM Cleaster Corin, NT wrote: CRM for notification. See Telephone encounter for:   05/11/17. Pt. Calling to get results from physical that was done in November pt can be reached at [phone] its ok to leave voicemail on pt. phone

## 2017-05-11 NOTE — Telephone Encounter (Signed)
Please review recent labs and advise.

## 2017-05-12 NOTE — Telephone Encounter (Signed)
Labs were normal and were released to mychart. OK to let her know

## 2017-05-12 NOTE — Telephone Encounter (Signed)
Patient notified

## 2017-09-13 ENCOUNTER — Encounter: Payer: Self-pay | Admitting: Family Medicine

## 2018-04-05 ENCOUNTER — Encounter: Payer: BC Managed Care – PPO | Admitting: Family Medicine

## 2018-04-09 ENCOUNTER — Encounter: Payer: Self-pay | Admitting: Family Medicine

## 2018-04-09 ENCOUNTER — Ambulatory Visit (INDEPENDENT_AMBULATORY_CARE_PROVIDER_SITE_OTHER): Payer: BC Managed Care – PPO | Admitting: Family Medicine

## 2018-04-09 VITALS — BP 128/81 | HR 67 | Temp 98.1°F | Ht 62.6 in | Wt 140.0 lb

## 2018-04-09 DIAGNOSIS — Z1329 Encounter for screening for other suspected endocrine disorder: Secondary | ICD-10-CM | POA: Diagnosis not present

## 2018-04-09 DIAGNOSIS — Z13220 Encounter for screening for lipoid disorders: Secondary | ICD-10-CM | POA: Diagnosis not present

## 2018-04-09 DIAGNOSIS — Z0000 Encounter for general adult medical examination without abnormal findings: Secondary | ICD-10-CM | POA: Diagnosis not present

## 2018-04-09 DIAGNOSIS — G43909 Migraine, unspecified, not intractable, without status migrainosus: Secondary | ICD-10-CM

## 2018-04-09 DIAGNOSIS — Z23 Encounter for immunization: Secondary | ICD-10-CM

## 2018-04-09 DIAGNOSIS — Z131 Encounter for screening for diabetes mellitus: Secondary | ICD-10-CM | POA: Diagnosis not present

## 2018-04-09 LAB — URINALYSIS, ROUTINE W REFLEX MICROSCOPIC
Bilirubin, UA: NEGATIVE
GLUCOSE, UA: NEGATIVE
Ketones, UA: NEGATIVE
LEUKOCYTES UA: NEGATIVE
Nitrite, UA: NEGATIVE
Protein, UA: NEGATIVE
RBC, UA: NEGATIVE
Specific Gravity, UA: 1.015 (ref 1.005–1.030)
UUROB: 1 mg/dL (ref 0.2–1.0)
pH, UA: 7 (ref 5.0–7.5)

## 2018-04-09 MED ORDER — SUMATRIPTAN SUCCINATE 100 MG PO TABS: 100.0000 mg | ORAL_TABLET | ORAL | 12 refills | Status: DC | PRN

## 2018-04-09 NOTE — Assessment & Plan Note (Signed)
The current medical regimen is effective;  continue present plan and medications.  

## 2018-04-09 NOTE — Patient Instructions (Signed)

## 2018-04-09 NOTE — Progress Notes (Signed)
BP 128/81   Pulse 67   Temp 98.1 F (36.7 C) (Oral)   Ht 5' 2.6" (1.59 m)   Wt 140 lb (63.5 kg)   SpO2 97%   BMI 25.12 kg/m    Subjective:    Patient ID: Cheryl Dawson, female    DOB: 1965-10-23, 52 y.o.   MRN: [id number]  HPI: Cheryl Dawson is a 52 y.o. female  Chief Complaint  Patient presents with  . Annual Exam  Patient all in all doing well taking sumatriptan 4-5 times a month weights until her headache gets pretty bad before she takes it and seems to work well when she does take it.   Relevant past medical, surgical, family and social history reviewed and updated as indicated. Interim medical history since our last visit reviewed. Allergies and medications reviewed and updated.  Review of Systems  Constitutional: Negative.   HENT: Negative.   Eyes: Negative.   Respiratory: Negative.   Cardiovascular: Negative.   Gastrointestinal: Negative.   Endocrine: Negative.   Genitourinary: Negative.   Musculoskeletal: Negative.   Skin: Negative.   Allergic/Immunologic: Negative.   Neurological: Negative.   Hematological: Negative.   Psychiatric/Behavioral: Negative.     Per HPI unless specifically indicated above     Objective:    BP 128/81   Pulse 67   Temp 98.1 F (36.7 C) (Oral)   Ht 5' 2.6" (1.59 m)   Wt 140 lb (63.5 kg)   SpO2 97%   BMI 25.12 kg/m   Wt Readings from Last 3 Encounters:  04/09/18 140 lb (63.5 kg)  04/03/17 144 lb 9.6 oz (65.6 kg)  03/28/16 145 lb 4.8 oz (65.9 kg)    Physical Exam  Constitutional: She is oriented to person, place, and time. She appears well-developed and well-nourished.  HENT:  Head: Normocephalic and atraumatic.  Right Ear: External ear normal.  Left Ear: External ear normal.  Nose: Nose normal.  Mouth/Throat: Oropharynx is clear and moist.  Eyes: Pupils are equal, round, and reactive to light. Conjunctivae and EOM are normal.  Neck: Normal range of motion. Neck supple. Carotid bruit is not present.    Cardiovascular: Normal rate, regular rhythm and normal heart sounds.  No murmur heard. Pulmonary/Chest: Effort normal and breath sounds normal. She exhibits no mass. Right breast exhibits no mass, no skin change and no tenderness. Left breast exhibits no mass, no skin change and no tenderness. Breasts are symmetrical.  Abdominal: Soft. Bowel sounds are normal. There is no hepatosplenomegaly.  Musculoskeletal: Normal range of motion.  Neurological: She is alert and oriented to person, place, and time.  Skin: No rash noted.  Psychiatric: She has a normal mood and affect. Her behavior is normal. Judgment and thought content normal.    Results for orders placed or performed in visit on 04/03/17  CBC with Differential/Platelet  Result Value Ref Range   WBC 4.5 3.4 - 10.8 x10E3/uL   RBC 4.83 3.77 - 5.28 x10E6/uL   Hemoglobin 14.0 11.1 - 15.9 g/dL   Hematocrit 41.6 34.0 - 46.6 %   MCV 86 79 - 97 fL   MCH 29.0 26.6 - 33.0 pg   MCHC 33.7 31.5 - 35.7 g/dL   RDW 13.0 12.3 - 15.4 %   Platelets 257 150 - 379 x10E3/uL   Neutrophils 49 Not Estab. %   Lymphs 41 Not Estab. %   Monocytes 8 Not Estab. %   Eos 1 Not Estab. %   Basos 1 Not Estab. %  Neutrophils Absolute 2.2 1.4 - 7.0 x10E3/uL   Lymphocytes Absolute 1.8 0.7 - 3.1 x10E3/uL   Monocytes Absolute 0.3 0.1 - 0.9 x10E3/uL   EOS (ABSOLUTE) 0.1 0.0 - 0.4 x10E3/uL   Basophils Absolute 0.0 0.0 - 0.2 x10E3/uL   Immature Granulocytes 0 Not Estab. %   Immature Grans (Abs) 0.0 0.0 - 0.1 x10E3/uL  Lipid panel  Result Value Ref Range   Cholesterol, Total 134 100 - 199 mg/dL   Triglycerides 78 0 - 149 mg/dL   HDL 51 >39 mg/dL   VLDL Cholesterol Cal 16 5 - 40 mg/dL   LDL Calculated 67 0 - 99 mg/dL   Chol/HDL Ratio 2.6 0.0 - 4.4 ratio  Comprehensive metabolic panel  Result Value Ref Range   Glucose 93 65 - 99 mg/dL   BUN 12 6 - 24 mg/dL   Creatinine, Ser 0.86 0.57 - 1.00 mg/dL   GFR calc non Af Amer 80 >59 mL/min/1.73   GFR calc Af Amer 92  >59 mL/min/1.73   BUN/Creatinine Ratio 14 9 - 23   Sodium 141 134 - 144 mmol/L   Potassium 4.2 3.5 - 5.2 mmol/L   Chloride 103 96 - 106 mmol/L   CO2 25 20 - 29 mmol/L   Calcium 9.3 8.7 - 10.2 mg/dL   Total Protein 6.9 6.0 - 8.5 g/dL   Albumin 4.6 3.5 - 5.5 g/dL   Globulin, Total 2.3 1.5 - 4.5 g/dL   Albumin/Globulin Ratio 2.0 1.2 - 2.2   Bilirubin Total 0.3 0.0 - 1.2 mg/dL   Alkaline Phosphatase 57 39 - 117 IU/L   AST 12 0 - 40 IU/L   ALT 12 0 - 32 IU/L  TSH  Result Value Ref Range   TSH 3.230 0.450 - 4.500 uIU/mL  Urinalysis, Routine w reflex microscopic  Result Value Ref Range   Specific Gravity, UA 1.015 1.005 - 1.030   pH, UA 8.5 (H) 5.0 - 7.5   Color, UA Yellow Yellow   Appearance Ur Turbid (A) Clear   Leukocytes, UA Negative Negative   Protein, UA Negative Negative/Trace   Glucose, UA Negative Negative   Ketones, UA Negative Negative   RBC, UA Negative Negative   Bilirubin, UA Negative Negative   Urobilinogen, Ur 0.2 0.2 - 1.0 mg/dL   Nitrite, UA Negative Negative      Assessment & Plan:   Problem List Items Addressed This Visit      Cardiovascular and Mediastinum   Migraine    The current medical regimen is effective;  continue present plan and medications.       Relevant Medications   SUMAtriptan (IMITREX) 100 MG tablet    Other Visit Diagnoses    Screening for diabetes mellitus (DM)    -  Primary   Relevant Orders   CBC with Differential/Platelet   Comprehensive metabolic panel   Urinalysis, Routine w reflex microscopic   Annual physical exam       Relevant Orders   CBC with Differential/Platelet   Comprehensive metabolic panel   Lipid panel   TSH   Urinalysis, Routine w reflex microscopic   Thyroid disorder screen       Relevant Orders   TSH   Need for lipid screening       Relevant Orders   Lipid panel   Needs flu shot       Relevant Orders   Flu Vaccine QUAD 6+ mos PF IM (Fluarix Quad PF) (Completed)    Discussed history of murmur  reviewed old medical history showing cardiac echo with very mild to trace mitral regurg.  Patient with no symptoms no audible murmur now will observe  Follow up plan: Return in about 1 year (around 04/10/2019).

## 2018-04-10 ENCOUNTER — Encounter: Payer: Self-pay | Admitting: Family Medicine

## 2018-04-10 LAB — COMPREHENSIVE METABOLIC PANEL
A/G RATIO: 2.1 (ref 1.2–2.2)
ALBUMIN: 4.5 g/dL (ref 3.5–5.5)
ALT: 8 IU/L (ref 0–32)
AST: 9 IU/L (ref 0–40)
Alkaline Phosphatase: 49 IU/L (ref 39–117)
BUN / CREAT RATIO: 18 (ref 9–23)
BUN: 12 mg/dL (ref 6–24)
Bilirubin Total: 0.4 mg/dL (ref 0.0–1.2)
CALCIUM: 9 mg/dL (ref 8.7–10.2)
CHLORIDE: 104 mmol/L (ref 96–106)
CO2: 22 mmol/L (ref 20–29)
Creatinine, Ser: 0.68 mg/dL (ref 0.57–1.00)
GFR, EST AFRICAN AMERICAN: 118 mL/min/{1.73_m2} (ref >59)
GFR, EST NON AFRICAN AMERICAN: 102 mL/min/{1.73_m2} (ref >59)
Globulin, Total: 2.1 g/dL (ref 1.5–4.5)
Glucose: 82 mg/dL (ref 65–99)
POTASSIUM: 4 mmol/L (ref 3.5–5.2)
Sodium: 139 mmol/L (ref 134–144)
TOTAL PROTEIN: 6.6 g/dL (ref 6.0–8.5)

## 2018-04-10 LAB — LIPID PANEL
CHOL/HDL RATIO: 2.4 ratio (ref 0.0–4.4)
Cholesterol, Total: 128 mg/dL (ref 100–199)
HDL: 53 mg/dL (ref >39)
LDL Calculated: 62 mg/dL (ref 0–99)
Triglycerides: 67 mg/dL (ref 0–149)
VLDL CHOLESTEROL CAL: 13 mg/dL (ref 5–40)

## 2018-04-10 LAB — CBC WITH DIFFERENTIAL/PLATELET
Basophils Absolute: 0 10*3/uL (ref 0.0–0.2)
Basos: 1 %
EOS (ABSOLUTE): 0 10*3/uL (ref 0.0–0.4)
EOS: 1 %
HEMOGLOBIN: 14.1 g/dL (ref 11.1–15.9)
Hematocrit: 40 % (ref 34.0–46.6)
IMMATURE GRANS (ABS): 0 10*3/uL (ref 0.0–0.1)
IMMATURE GRANULOCYTES: 0 %
LYMPHS: 38 %
Lymphocytes Absolute: 1.8 10*3/uL (ref 0.7–3.1)
MCH: 30.3 pg (ref 26.6–33.0)
MCHC: 35.3 g/dL (ref 31.5–35.7)
MCV: 86 fL (ref 79–97)
MONOCYTES: 8 %
Monocytes Absolute: 0.4 10*3/uL (ref 0.1–0.9)
NEUTROS ABS: 2.5 10*3/uL (ref 1.4–7.0)
NEUTROS PCT: 52 %
PLATELETS: 227 10*3/uL (ref 150–450)
RBC: 4.66 x10E6/uL (ref 3.77–5.28)
RDW: 12.3 % (ref 12.3–15.4)
WBC: 4.8 10*3/uL (ref 3.4–10.8)

## 2018-04-10 LAB — TSH: TSH: 2.63 u[IU]/mL (ref 0.450–4.500)

## 2018-04-11 ENCOUNTER — Telehealth: Payer: Self-pay | Admitting: Family Medicine

## 2018-04-11 NOTE — Telephone Encounter (Signed)
Call Pt to set up appointment to address colonoscopy with Dr. Jeananne Rama, no answer left voicemail for pt to call back and set up appoint for after 07/06/18. If pt calls back please set up appointment.

## 2018-05-31 ENCOUNTER — Other Ambulatory Visit: Payer: Self-pay

## 2018-05-31 ENCOUNTER — Encounter: Payer: Self-pay | Admitting: Family Medicine

## 2018-05-31 ENCOUNTER — Ambulatory Visit: Payer: BC Managed Care – PPO | Admitting: Family Medicine

## 2018-05-31 VITALS — BP 120/80 | HR 59 | Temp 97.4°F | Ht 63.0 in | Wt 140.0 lb

## 2018-05-31 DIAGNOSIS — R21 Rash and other nonspecific skin eruption: Secondary | ICD-10-CM | POA: Diagnosis not present

## 2018-05-31 MED ORDER — CLOBETASOL PROPIONATE 0.05 % EX OINT: 1.0000 "application " | TOPICAL_OINTMENT | Freq: Two times a day (BID) | CUTANEOUS | 0 refills | Status: DC

## 2018-05-31 NOTE — Progress Notes (Signed)
BP 120/80   Pulse (!) 59   Temp (!) 97.4 F (36.3 C) (Oral)   Ht 5\' 3"  (1.6 m)   Wt 140 lb (63.5 kg)   SpO2 99%   BMI 24.80 kg/m    Subjective:    Patient ID: Cheryl Dawson, female    DOB: September 17, 1965, 53 y.o.   MRN: [id number]  HPI: Cheryl Dawson is a 53 y.o. female  Chief Complaint  Patient presents with  . Rash    bilateral hands since last saturday, itching   Here today with 1 week of itchy and swollen rash on b/l hands and wrists. No known trigger, no new products, no hx of skin allergies, new foods or medications. Has not been outside recently. Using cera ve and triple antibiotic ointment with minimal improvement.   Relevant past medical, surgical, family and social history reviewed and updated as indicated. Interim medical history since our last visit reviewed. Allergies and medications reviewed and updated.  Review of Systems  Per HPI unless specifically indicated above     Objective:    BP 120/80   Pulse (!) 59   Temp (!) 97.4 F (36.3 C) (Oral)   Ht 5\' 3"  (1.6 m)   Wt 140 lb (63.5 kg)   SpO2 99%   BMI 24.80 kg/m   Wt Readings from Last 3 Encounters:  05/31/18 140 lb (63.5 kg)  04/09/18 140 lb (63.5 kg)  04/03/17 144 lb 9.6 oz (65.6 kg)    Physical Exam Vitals signs and nursing note reviewed.  Constitutional:      Appearance: Normal appearance. She is not ill-appearing.  HENT:     Head: Atraumatic.  Eyes:     Extraocular Movements: Extraocular movements intact.     Conjunctiva/sclera: Conjunctivae normal.  Neck:     Musculoskeletal: Normal range of motion and neck supple.  Cardiovascular:     Rate and Rhythm: Normal rate and regular rhythm.     Heart sounds: Normal heart sounds.  Pulmonary:     Effort: Pulmonary effort is normal.     Breath sounds: Normal breath sounds.  Musculoskeletal: Normal range of motion.  Skin:    General: Skin is warm and dry.     Findings: Rash (erythematous maculopapular rash with some areas of edema across b/l  hands and wrists) present.  Neurological:     Mental Status: She is alert and oriented to person, place, and time.  Psychiatric:        Mood and Affect: Mood normal.        Thought Content: Thought content normal.        Judgment: Judgment normal.     Results for orders placed or performed in visit on 04/09/18  CBC with Differential/Platelet  Result Value Ref Range   WBC 4.8 3.4 - 10.8 x10E3/uL   RBC 4.66 3.77 - 5.28 x10E6/uL   Hemoglobin 14.1 11.1 - 15.9 g/dL   Hematocrit 40.0 34.0 - 46.6 %   MCV 86 79 - 97 fL   MCH 30.3 26.6 - 33.0 pg   MCHC 35.3 31.5 - 35.7 g/dL   RDW 12.3 12.3 - 15.4 %   Platelets 227 150 - 450 x10E3/uL   Neutrophils 52 Not Estab. %   Lymphs 38 Not Estab. %   Monocytes 8 Not Estab. %   Eos 1 Not Estab. %   Basos 1 Not Estab. %   Neutrophils Absolute 2.5 1.4 - 7.0 x10E3/uL   Lymphocytes Absolute 1.8 0.7 -  3.1 x10E3/uL   Monocytes Absolute 0.4 0.1 - 0.9 x10E3/uL   EOS (ABSOLUTE) 0.0 0.0 - 0.4 x10E3/uL   Basophils Absolute 0.0 0.0 - 0.2 x10E3/uL   Immature Granulocytes 0 Not Estab. %   Immature Grans (Abs) 0.0 0.0 - 0.1 x10E3/uL  Comprehensive metabolic panel  Result Value Ref Range   Glucose 82 65 - 99 mg/dL   BUN 12 6 - 24 mg/dL   Creatinine, Ser 0.68 0.57 - 1.00 mg/dL   GFR calc non Af Amer 102 >59 mL/min/1.73   GFR calc Af Amer 118 >59 mL/min/1.73   BUN/Creatinine Ratio 18 9 - 23   Sodium 139 134 - 144 mmol/L   Potassium 4.0 3.5 - 5.2 mmol/L   Chloride 104 96 - 106 mmol/L   CO2 22 20 - 29 mmol/L   Calcium 9.0 8.7 - 10.2 mg/dL   Total Protein 6.6 6.0 - 8.5 g/dL   Albumin 4.5 3.5 - 5.5 g/dL   Globulin, Total 2.1 1.5 - 4.5 g/dL   Albumin/Globulin Ratio 2.1 1.2 - 2.2   Bilirubin Total 0.4 0.0 - 1.2 mg/dL   Alkaline Phosphatase 49 39 - 117 IU/L   AST 9 0 - 40 IU/L   ALT 8 0 - 32 IU/L  Lipid panel  Result Value Ref Range   Cholesterol, Total 128 100 - 199 mg/dL   Triglycerides 67 0 - 149 mg/dL   HDL 53 >39 mg/dL   VLDL Cholesterol Cal 13 5 -  40 mg/dL   LDL Calculated 62 0 - 99 mg/dL   Chol/HDL Ratio 2.4 0.0 - 4.4 ratio  TSH  Result Value Ref Range   TSH 2.630 0.450 - 4.500 uIU/mL  Urinalysis, Routine w reflex microscopic  Result Value Ref Range   Specific Gravity, UA 1.015 1.005 - 1.030   pH, UA 7.0 5.0 - 7.5   Color, UA Yellow Yellow   Appearance Ur Clear Clear   Leukocytes, UA Negative Negative   Protein, UA Negative Negative/Trace   Glucose, UA Negative Negative   Ketones, UA Negative Negative   RBC, UA Negative Negative   Bilirubin, UA Negative Negative   Urobilinogen, Ur 1.0 0.2 - 1.0 mg/dL   Nitrite, UA Negative Negative      Assessment & Plan:   Problem List Items Addressed This Visit    None    Visit Diagnoses    Rash    -  Primary   Appears allergic, unknown trigger. Tx with antihistamines, clobetasol BID x 1-2 weeks, cera ve BID. F/u if not improving in 1 week       Follow up plan: Return if symptoms worsen or fail to improve.

## 2018-07-02 ENCOUNTER — Ambulatory Visit: Payer: BC Managed Care – PPO | Admitting: Family Medicine

## 2018-11-29 ENCOUNTER — Telehealth: Payer: Self-pay | Admitting: Family Medicine

## 2018-11-29 DIAGNOSIS — Z1239 Encounter for other screening for malignant neoplasm of breast: Secondary | ICD-10-CM

## 2018-11-29 NOTE — Telephone Encounter (Signed)
Pt would like to have mammogram order placed so she can call and schedule appt around her CPE.

## 2019-02-21 ENCOUNTER — Other Ambulatory Visit: Payer: Self-pay

## 2019-02-21 ENCOUNTER — Ambulatory Visit (INDEPENDENT_AMBULATORY_CARE_PROVIDER_SITE_OTHER): Payer: BC Managed Care – PPO | Admitting: Nurse Practitioner

## 2019-02-21 ENCOUNTER — Encounter: Payer: Self-pay | Admitting: Nurse Practitioner

## 2019-02-21 VITALS — Ht 63.0 in | Wt 140.0 lb

## 2019-02-21 DIAGNOSIS — Z20828 Contact with and (suspected) exposure to other viral communicable diseases: Secondary | ICD-10-CM | POA: Diagnosis not present

## 2019-02-21 DIAGNOSIS — R059 Cough, unspecified: Secondary | ICD-10-CM

## 2019-02-21 DIAGNOSIS — R05 Cough: Secondary | ICD-10-CM

## 2019-02-21 DIAGNOSIS — Z20822 Contact with and (suspected) exposure to covid-19: Secondary | ICD-10-CM

## 2019-02-21 MED ORDER — BENZONATATE 100 MG PO CAPS: 100.0000 mg | ORAL_CAPSULE | Freq: Three times a day (TID) | ORAL | 0 refills | Status: DC | PRN

## 2019-02-21 NOTE — Patient Instructions (Signed)

## 2019-02-21 NOTE — Progress Notes (Signed)
Ht 5\' 3"  (1.6 m)   Wt 140 lb (63.5 kg)   BMI 24.80 kg/m    Subjective:    Patient ID: Cheryl Dawson, female    DOB: 10-16-1965, 53 y.o.   MRN: [id number]  HPI: Cheryl Dawson is a 53 y.o. female  Chief Complaint  Patient presents with  . Cough    Coworker tested postive for COVID19. Patient currently quaratined. Symptoms ongoing 3 days,    . This visit was completed via [employer] due to the restrictions of the COVID-19 pandemic. All issues as above were discussed and addressed. Physical exam was done as above through visual confirmation on Doximity. If it was felt that the patient should be evaluated in the office, they were directed there. The patient verbally consented to this visit. . Location of the patient: home . Location of the provider: home . Those involved with this call:  . Provider: Marnee Guarneri, DNP . CMA: Merilyn Baba, CMA . Front Desk/Registration: Jill Side  . Time spent on call: 15 minutes with patient face to face via video conference. More than 50% of this time was spent in counseling and coordination of care. 10 minutes total spent in review of patient's record and preparation of their chart.  . I verified patient identity using two factors (patient name and date of birth). Patient consents verbally to being seen via telemedicine visit today.    UPPER RESPIRATORY TRACT INFECTION Had a coworker who tested positive for Covid, she got the call yesterday.  Works in Estate manager/land agent at [employer].  Patient current symptoms started 3 days ago, mainly upper respiratory.  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 Fever: no Cough: yes Shortness of breath: no Wheezing: no Chest pain: no Chest tightness: no Chest congestion: no Nasal congestion: no Runny nose: no Post nasal drip: yes Sneezing: yes Sore throat: no Swollen glands: no Sinus pressure: no Headache: no Face pain: no Toothache: no Ear pain: none  Ear pressure: none Eyes red/itching:no Eye drainage/crusting: no  Vomiting: no Rash: no Fatigue: yes Sick contacts: yes Strep contacts: no  Context: stable Recurrent sinusitis: no Relief with OTC cold/cough medications: some  Treatments attempted: cold/sinus and cough syrup   Relevant past medical, surgical, family and social history reviewed and updated as indicated. Interim medical history since our last visit reviewed. Allergies and medications reviewed and updated.  Review of Systems  Constitutional: Positive for fatigue. Negative for activity change, appetite change and fever.  HENT: Positive for postnasal drip and rhinorrhea. Negative for congestion, ear discharge, ear pain, facial swelling, sinus pressure, sinus pain, sneezing, sore throat and voice change.   Eyes: Negative for pain and visual disturbance.  Respiratory: Positive for cough. Negative for chest tightness, shortness of breath and wheezing.   Cardiovascular: Negative for chest pain, palpitations and leg swelling.  Gastrointestinal: Negative for abdominal distention, abdominal pain, constipation, diarrhea, nausea and vomiting.  Endocrine: Negative.   Musculoskeletal: Negative for myalgias.  Neurological: Negative for dizziness, numbness and headaches.  Psychiatric/Behavioral: Negative.     Per HPI unless specifically indicated above     Objective:    Ht 5\' 3"  (1.6 m)   Wt 140 lb (63.5 kg)   BMI 24.80 kg/m   Wt Readings from Last 3 Encounters:  02/21/19 140 lb (63.5 kg)  05/31/18 140 lb (63.5 kg)  04/09/18 140 lb (63.5 kg)    Physical Exam Vitals signs and nursing note reviewed.  Constitutional:  General: She is awake. She is not in acute distress.    Appearance: She is well-developed. She is not ill-appearing.  HENT:     Head: Normocephalic.     Right Ear: Hearing normal.     Left Ear: Hearing normal.  Eyes:     General: Lids are normal.        Right eye: No discharge.        Left eye: No  discharge.     Conjunctiva/sclera: Conjunctivae normal.  Neck:     Musculoskeletal: Normal range of motion.  Pulmonary:     Effort: Pulmonary effort is normal. No accessory muscle usage or respiratory distress.     Comments: Unable to auscultate due to virtual exam only.  No SOB noted with talking.  Intermittent nonproductive cough present. Neurological:     Mental Status: She is alert and oriented to person, place, and time.  Psychiatric:        Attention and Perception: Attention normal.        Mood and Affect: Mood normal.        Behavior: Behavior normal. Behavior is cooperative.        Thought Content: Thought content normal.        Judgment: Judgment normal.     Results for orders placed or performed in visit on 04/09/18  CBC with Differential/Platelet  Result Value Ref Range   WBC 4.8 3.4 - 10.8 x10E3/uL   RBC 4.66 3.77 - 5.28 x10E6/uL   Hemoglobin 14.1 11.1 - 15.9 g/dL   Hematocrit 40.0 34.0 - 46.6 %   MCV 86 79 - 97 fL   MCH 30.3 26.6 - 33.0 pg   MCHC 35.3 31.5 - 35.7 g/dL   RDW 12.3 12.3 - 15.4 %   Platelets 227 150 - 450 x10E3/uL   Neutrophils 52 Not Estab. %   Lymphs 38 Not Estab. %   Monocytes 8 Not Estab. %   Eos 1 Not Estab. %   Basos 1 Not Estab. %   Neutrophils Absolute 2.5 1.4 - 7.0 x10E3/uL   Lymphocytes Absolute 1.8 0.7 - 3.1 x10E3/uL   Monocytes Absolute 0.4 0.1 - 0.9 x10E3/uL   EOS (ABSOLUTE) 0.0 0.0 - 0.4 x10E3/uL   Basophils Absolute 0.0 0.0 - 0.2 x10E3/uL   Immature Granulocytes 0 Not Estab. %   Immature Grans (Abs) 0.0 0.0 - 0.1 x10E3/uL  Comprehensive metabolic panel  Result Value Ref Range   Glucose 82 65 - 99 mg/dL   BUN 12 6 - 24 mg/dL   Creatinine, Ser 0.68 0.57 - 1.00 mg/dL   GFR calc non Af Amer 102 >59 mL/min/1.73   GFR calc Af Amer 118 >59 mL/min/1.73   BUN/Creatinine Ratio 18 9 - 23   Sodium 139 134 - 144 mmol/L   Potassium 4.0 3.5 - 5.2 mmol/L   Chloride 104 96 - 106 mmol/L   CO2 22 20 - 29 mmol/L   Calcium 9.0 8.7 - 10.2  mg/dL   Total Protein 6.6 6.0 - 8.5 g/dL   Albumin 4.5 3.5 - 5.5 g/dL   Globulin, Total 2.1 1.5 - 4.5 g/dL   Albumin/Globulin Ratio 2.1 1.2 - 2.2   Bilirubin Total 0.4 0.0 - 1.2 mg/dL   Alkaline Phosphatase 49 39 - 117 IU/L   AST 9 0 - 40 IU/L   ALT 8 0 - 32 IU/L  Lipid panel  Result Value Ref Range   Cholesterol, Total 128 100 - 199 mg/dL   Triglycerides 67  0 - 149 mg/dL   HDL 53 >39 mg/dL   VLDL Cholesterol Cal 13 5 - 40 mg/dL   LDL Calculated 62 0 - 99 mg/dL   Chol/HDL Ratio 2.4 0.0 - 4.4 ratio  TSH  Result Value Ref Range   TSH 2.630 0.450 - 4.500 uIU/mL  Urinalysis, Routine w reflex microscopic  Result Value Ref Range   Specific Gravity, UA 1.015 1.005 - 1.030   pH, UA 7.0 5.0 - 7.5   Color, UA Yellow Yellow   Appearance Ur Clear Clear   Leukocytes, UA Negative Negative   Protein, UA Negative Negative/Trace   Glucose, UA Negative Negative   Ketones, UA Negative Negative   RBC, UA Negative Negative   Bilirubin, UA Negative Negative   Urobilinogen, Ur 1.0 0.2 - 1.0 mg/dL   Nitrite, UA Negative Negative      Assessment & Plan:   Problem List Items Addressed This Visit      Other   Close exposure to COVID-19 virus    Patient currently on quarantine due to work exposure at [employer].  Covid outpatient testing ordered.  Script for [employer] sent.  Have recommended maintaining a 14 day at home quarantine at this time based on current symptoms and exposure.  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  They will return in 2 weeks for work clearance and lung check.       Other Visit Diagnoses    Cough    -  Primary   Relevant Orders   Novel Coronavirus, NAA ([facility])      I discussed the assessment and treatment plan with the patient. The patient was provided an opportunity to ask questions and all were answered. The patient agreed with the plan and demonstrated an understanding of the instructions.   The  patient was advised to call back or seek an in-person evaluation if the symptoms worsen or if the condition fails to improve as anticipated.   I provided 15 minutes of time during this encounter.  Follow up plan: Return in about 2 weeks (around 03/07/2019) for Covid exposure follow-up.

## 2019-02-21 NOTE — Assessment & Plan Note (Signed)
Patient currently on quarantine due to work exposure at [employer].  Covid outpatient testing ordered.  Script for [employer] sent.  Have recommended maintaining a 14 day at home quarantine at this time based on current symptoms and exposure.  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  They will return in 2 weeks for work clearance and lung check.

## 2019-02-22 ENCOUNTER — Encounter: Payer: Self-pay | Admitting: Nurse Practitioner

## 2019-02-22 LAB — NOVEL CORONAVIRUS, NAA: SARS-CoV-2, NAA: DETECTED — AB

## 2019-02-22 NOTE — Progress Notes (Signed)
Covid positive letter out

## 2019-02-27 ENCOUNTER — Telehealth: Payer: Self-pay

## 2019-02-27 NOTE — Telephone Encounter (Signed)
Called patient and scheduled 2 week f/up for 03/08/19.   [name]- does this need to be a virtual visit or in office? Also, patient states she needs a letter from you sent to her Cheryl Dawson stating that you told her on 02/21/19 to be in a 14 day quarantine period for her work.

## 2019-02-27 NOTE — Telephone Encounter (Signed)
Virtual is fine and I just looked and I did send letter to her MyChart can you ensure she got it, it is under letters.  Sent it day I got the result and talked to her.

## 2019-02-28 NOTE — Telephone Encounter (Signed)
Patient notified and verbalized understanding. 

## 2019-03-08 ENCOUNTER — Other Ambulatory Visit: Payer: Self-pay

## 2019-03-08 ENCOUNTER — Encounter: Payer: Self-pay | Admitting: Nurse Practitioner

## 2019-03-08 ENCOUNTER — Ambulatory Visit (INDEPENDENT_AMBULATORY_CARE_PROVIDER_SITE_OTHER): Payer: BC Managed Care – PPO | Admitting: Nurse Practitioner

## 2019-03-08 DIAGNOSIS — Z20828 Contact with and (suspected) exposure to other viral communicable diseases: Secondary | ICD-10-CM

## 2019-03-08 DIAGNOSIS — Z20822 Contact with and (suspected) exposure to covid-19: Secondary | ICD-10-CM

## 2019-03-08 NOTE — Patient Instructions (Signed)

## 2019-03-08 NOTE — Assessment & Plan Note (Signed)
Tested positive for Covid and has maintained 14 day quarantine, may return to work and has note from health department.  To return to office if any worsening or continued symptoms.

## 2019-03-08 NOTE — Progress Notes (Signed)
There were no vitals taken for this visit.   Subjective:    Patient ID: Cheryl Dawson, female    DOB: December 07, 1965, 53 y.o.   MRN: [id number]  HPI: Cheryl Dawson is a 53 y.o. female  Chief Complaint  Patient presents with  . Cough    2 week COVID f/up    . This visit was completed via D[employer]ity due to the restrictions of the COVID-19 pandemic. All issues as above were discussed and addressed. Physical exam was done as above through visual confirmation on Doximity. If it was felt that the patient should be evaluated in the office, they were directed there. The patient verbally consented to this visit. . Location of the patient: home . Location of the provider: work . Those involved with this call:  . Provider: Marnee Guarneri, DNP . CMA: Yvonna Alanis, CMA . Front Desk/Registration: Jill Side  . Time spent on call: 15 minutes with patient face to face via video conference. More than 50% of this time was spent in counseling and coordination of care. 10 minutes total spent in review of patient's record and preparation of their chart.  . I verified patient identity using two factors (patient name and date of birth). Patient consents verbally to being seen via telemedicine visit today.    COVID FOLLOW-UP: Had positive Covid testing on 02/21/2019 after exposure at her workplace, [employer] in [employer].  She did maintain a 14 day quarantine.  Her entire household tested positive, son and husband.  All are improving.  She has been given a note by the health department to return to work.  Denies SOB, loss of taste/smell, CP, fever.  Does have intermittent, nonproductive cough but reports this is improved.    Relevant past medical, surgical, family and social history reviewed and updated as indicated. Interim medical history since our last visit reviewed. Allergies and medications reviewed and updated.  Review of Systems  Constitutional: Positive for fatigue. Negative for  activity change, appetite change and fever.  HENT: Negative for congestion, ear discharge, ear pain, facial swelling, postnasal drip, rhinorrhea, sinus pressure, sinus pain, sneezing, sore throat and voice change.   Eyes: Negative for pain and visual disturbance.  Respiratory: Positive for cough. Negative for chest tightness, shortness of breath and wheezing.   Cardiovascular: Negative for chest pain, palpitations and leg swelling.  Gastrointestinal: Negative for abdominal distention, abdominal pain, constipation, diarrhea, nausea and vomiting.  Endocrine: Negative.   Musculoskeletal: Negative for myalgias.  Neurological: Negative for dizziness, numbness and headaches.  Psychiatric/Behavioral: Negative.     Per HPI unless specifically indicated above     Objective:    There were no vitals taken for this visit.  Wt Readings from Last 3 Encounters:  02/21/19 140 lb (63.5 kg)  05/31/18 140 lb (63.5 kg)  04/09/18 140 lb (63.5 kg)    Physical Exam Vitals signs and nursing note reviewed.  Constitutional:      General: She is awake. She is not in acute distress.    Appearance: She is well-developed. She is not ill-appearing.  HENT:     Head: Normocephalic.     Right Ear: Hearing normal.     Left Ear: Hearing normal.  Eyes:     General: Lids are normal.        Right eye: No discharge.        Left eye: No discharge.     Conjunctiva/sclera: Conjunctivae normal.  Neck:     Musculoskeletal: Normal range  of motion.  Pulmonary:     Effort: Pulmonary effort is normal. No accessory muscle usage or respiratory distress.     Comments: Unable to auscultate due to virtual exam only.  No SOB with talking and no cough noted during exam period. Neurological:     Mental Status: She is alert and oriented to person, place, and time.  Psychiatric:        Attention and Perception: Attention normal.        Mood and Affect: Mood normal.        Behavior: Behavior normal. Behavior is cooperative.         Thought Content: Thought content normal.        Judgment: Judgment normal.     Results for orders placed or performed in visit on 02/21/19  Novel Coronavirus, NAA ([facility])   Specimen: Oropharyngeal(OP) collection in vial transport medium   OROPHARYNGEA  TESTING  Result Value Ref Range   SARS-CoV-2, NAA Detected (A) Not Detected      Assessment & Plan:   Problem List Items Addressed This Visit      Other   RESOLVED: Close exposure to COVID-19 virus    Tested positive for Covid and has maintained 14 day quarantine, may return to work and has note from health department.  To return to office if any worsening or continued symptoms.         I discussed the assessment and treatment plan with the patient. The patient was provided an opportunity to ask questions and all were answered. The patient agreed with the plan and demonstrated an understanding of the instructions.   The patient was advised to call back or seek an in-person evaluation if the symptoms worsen or if the condition fails to improve as anticipated.   I provided 15 minutes of time during this encounter.  Follow up plan: No follow-ups on file.

## 2019-03-26 ENCOUNTER — Other Ambulatory Visit: Payer: Self-pay | Admitting: Nurse Practitioner

## 2019-03-26 ENCOUNTER — Other Ambulatory Visit: Payer: Self-pay | Admitting: Family Medicine

## 2019-03-26 ENCOUNTER — Telehealth: Payer: Self-pay

## 2019-03-26 DIAGNOSIS — Z1211 Encounter for screening for malignant neoplasm of colon: Secondary | ICD-10-CM

## 2019-03-26 DIAGNOSIS — Z1231 Encounter for screening mammogram for malignant neoplasm of breast: Secondary | ICD-10-CM

## 2019-03-26 NOTE — Telephone Encounter (Signed)
Please order colonoscopy  Copied from [place] [id number]. Topic: Referral - Status >> Mar 26, 2019  1:57 PM Cheryl Dawson L wrote: Reason for CRM:   Pt wants to know when her colonoscopy has been scheduled for.

## 2019-03-26 NOTE — Progress Notes (Signed)
Colonoscopy referral

## 2019-03-26 NOTE — Telephone Encounter (Signed)
Referral to GI placed

## 2019-03-27 ENCOUNTER — Telehealth: Payer: Self-pay

## 2019-03-27 ENCOUNTER — Other Ambulatory Visit: Payer: Self-pay

## 2019-03-27 DIAGNOSIS — Z1211 Encounter for screening for malignant neoplasm of colon: Secondary | ICD-10-CM

## 2019-03-27 MED ORDER — NA SULFATE-K SULFATE-MG SULF 17.5-3.13-1.6 GM/177ML PO SOLN: 1.0000 | Freq: Once | ORAL | 0 refills | Status: AC

## 2019-03-27 NOTE — Telephone Encounter (Signed)
Gastroenterology Pre-Procedure Review  Request Date: Friday 04/26/19 Requesting Physician: Dr. Bonna Gains  PATIENT REVIEW QUESTIONS: The patient responded to the following health history questions as indicated:    1. Are you having any GI issues? no 2. Do you have a personal history of Polyps? no 3. Do you have a family history of Colon Cancer or Polyps? no 4. Diabetes Mellitus? no 5. Joint replacements in the past 12 months?no 6. Major health problems in the past 3 months?no 7. Any artificial heart valves, MVP, or defibrillator?no    MEDICATIONS & ALLERGIES:    Patient reports the following regarding taking any anticoagulation/antiplatelet therapy:   Plavix, Coumadin, Eliquis, Xarelto, Lovenox, Pradaxa, Brilinta, or Effient? no Aspirin? no  Patient confirms/reports the following medications:  Current Outpatient Medications  Medication Sig Dispense Refill  . benzonatate (TESSALON) 100 MG capsule Take 1 capsule (100 mg total) by mouth 3 (three) times daily as needed for cough. 20 capsule 0  . clobetasol ointment (TEMOVATE) AB-123456789 % Apply 1 application topically 2 (two) times daily. 60 g 0  . SUMAtriptan (IMITREX) 100 MG tablet Take 1 tablet (100 mg total) by mouth every 2 (two) hours as needed for migraine (max 2 a day). May repeat in 2 hours if headache persists 10 tablet 12   No current facility-administered medications for this visit.     Patient confirms/reports the following allergies:  Allergies  Allergen Reactions  . Sulfa Antibiotics Rash    No orders of the defined types were placed in this encounter.   AUTHORIZATION INFORMATION Primary Insurance: 1D#: Group #:  Secondary Insurance: 1D#: Group #:  SCHEDULE INFORMATION: Date: 04/26/19 Time: Location:[facility]

## 2019-04-15 ENCOUNTER — Other Ambulatory Visit: Payer: Self-pay

## 2019-04-15 ENCOUNTER — Encounter: Payer: Self-pay | Admitting: Family Medicine

## 2019-04-15 ENCOUNTER — Ambulatory Visit (INDEPENDENT_AMBULATORY_CARE_PROVIDER_SITE_OTHER): Payer: BC Managed Care – PPO | Admitting: Family Medicine

## 2019-04-15 ENCOUNTER — Encounter: Payer: BC Managed Care – PPO | Admitting: Family Medicine

## 2019-04-15 VITALS — BP 124/79 | HR 64 | Temp 99.0°F | Ht 63.0 in | Wt 144.0 lb

## 2019-04-15 DIAGNOSIS — Z0000 Encounter for general adult medical examination without abnormal findings: Secondary | ICD-10-CM | POA: Diagnosis not present

## 2019-04-15 DIAGNOSIS — G43909 Migraine, unspecified, not intractable, without status migrainosus: Secondary | ICD-10-CM | POA: Diagnosis not present

## 2019-04-15 DIAGNOSIS — H409 Unspecified glaucoma: Secondary | ICD-10-CM | POA: Insufficient documentation

## 2019-04-15 LAB — UA/M W/RFLX CULTURE, ROUTINE
Bilirubin, UA: NEGATIVE
Glucose, UA: NEGATIVE
Ketones, UA: NEGATIVE
Leukocytes,UA: NEGATIVE
Nitrite, UA: NEGATIVE
Protein,UA: NEGATIVE
Specific Gravity, UA: 1.025 (ref 1.005–1.030)
Urobilinogen, Ur: 0.2 mg/dL (ref 0.2–1.0)
pH, UA: 5.5 (ref 5.0–7.5)

## 2019-04-15 LAB — MICROSCOPIC EXAMINATION: WBC, UA: NONE SEEN /hpf (ref 0–5)

## 2019-04-15 MED ORDER — SUMATRIPTAN SUCCINATE 100 MG PO TABS: 100.0000 mg | ORAL_TABLET | ORAL | 11 refills | Status: DC | PRN

## 2019-04-15 NOTE — Progress Notes (Signed)
BP 124/79   Pulse 64   Temp 99 F (37.2 C) (Oral)   Ht 5\' 3"  (1.6 m)   Wt 144 lb (65.3 kg)   SpO2 97%   BMI 25.51 kg/m    Subjective:    Patient ID: Cheryl Dawson, female    DOB: Oct 09, 1965, 53 y.o.   MRN: [id number]  HPI: Cheryl Dawson is a 53 y.o. female presenting on 04/15/2019 for comprehensive medical examination. Current medical complaints include:see below  Having to take her imitrex 2-3 times per week, states she partially thinks this is work stress and also thinks it has a lot to do with uncontrolled glaucoma. Seeing a new specialist for this and is undergoing some testing and treatment discussion next month. The imitrex does seem to help prn.   She currently lives with: Menopausal Symptoms: no  Depression Screen done today and results listed below:  Depression screen [facility] 2/9 04/09/2018 03/28/2016  Decreased Interest 2 2  Down, Depressed, Hopeless 2 1  PHQ - 2 Score 4 3  Altered sleeping 2 0  Tired, decreased energy 3 2  Change in appetite 0 2  Feeling bad or failure about yourself  0 0  Trouble concentrating 1 0  Moving slowly or fidgety/restless 0 1  Suicidal thoughts 0 0  PHQ-9 Score 10 8    The patient does not have a history of falls. I did complete a risk assessment for falls. A plan of care for falls was documented.   Past Medical History:  Past Medical History:  Diagnosis Date  . Migraine   . Undiagnosed cardiac murmurs   . Uterine fibroids affecting pregnancy     Surgical History:  Past Surgical History:  Procedure Laterality Date  . ABDOMINAL HYSTERECTOMY    . CESAREAN SECTION     x2  . DOPPLER ECHOCARDIOGRAPHY  2006  . tubes reversed      Medications:  Current Outpatient Medications on File Prior to Visit  Medication Sig  . clobetasol ointment (TEMOVATE) AB-123456789 % Apply 1 application topically 2 (two) times daily.   No current facility-administered medications on file prior to visit.     Allergies:  Allergies  Allergen Reactions   . Penicillins Hives  . Sulfa Antibiotics Rash    Social History:  Social History   Socioeconomic History  . Marital status: Married    Spouse name: Not on file  . Number of children: Not on file  . Years of education: Not on file  . Highest education level: Not on file  Occupational History  . Not on file  Social Needs  . Financial resource strain: Not on file  . Food insecurity    Worry: Not on file    Inability: Not on file  . Transportation needs    Medical: Not on file    Non-medical: Not on file  Tobacco Use  . Smoking status: Never Smoker  . Smokeless tobacco: Never Used  Substance and Sexual Activity  . Alcohol use: Yes    Alcohol/week: 0.0 standard drinks    Comment: rare  . Drug use: No  . Sexual activity: Not on file  Lifestyle  . Physical activity    Days per week: Not on file    Minutes per session: Not on file  . Stress: Not on file  Relationships  . Social Herbalist on phone: Not on file    Gets together: Not on file    Attends religious service:  Not on file    Active member of club or organization: Not on file    Attends meetings of clubs or organizations: Not on file    Relationship status: Not on file  . Intimate partner violence    Fear of current or ex partner: Not on file    Emotionally abused: Not on file    Physically abused: Not on file    Forced sexual activity: Not on file  Other Topics Concern  . Not on file  Social History Narrative  . Not on file   Social History   Tobacco Use  Smoking Status Never Smoker  Smokeless Tobacco Never Used   Social History   Substance and Sexual Activity  Alcohol Use Yes  . Alcohol/week: 0.0 standard drinks   Comment: rare    Family History:  Family History  Problem Relation Age of Onset  . Aneurysm Mother   . Diabetes Father   . Heart disease Sister   . Heart disease Brother   . Stroke Brother   . Breast cancer Maternal Aunt     Past medical history, surgical history,  medications, allergies, family history and social history reviewed with patient today and changes made to appropriate areas of the chart.   Review of Systems - General ROS: negative Psychological ROS: negative Ophthalmic ROS: positive for - blurry vision ENT ROS: negative Allergy and Immunology ROS: negative Hematological and Lymphatic ROS: negative Endocrine ROS: negative Breast ROS: negative for breast lumps Respiratory ROS: no cough, shortness of breath, or wheezing Cardiovascular ROS: no chest pain or dyspnea on exertion Gastrointestinal ROS: no abdominal pain, change in bowel habits, or black or bloody stools Genito-Urinary ROS: no dysuria, trouble voiding, or hematuria Musculoskeletal ROS: negative Neurological ROS: no TIA or stroke symptoms Dermatological ROS: negative All other ROS negative except what is listed above and in the HPI.      Objective:    BP 124/79   Pulse 64   Temp 99 F (37.2 C) (Oral)   Ht 5\' 3"  (1.6 m)   Wt 144 lb (65.3 kg)   SpO2 97%   BMI 25.51 kg/m   Wt Readings from Last 3 Encounters:  04/15/19 144 lb (65.3 kg)  02/21/19 140 lb (63.5 kg)  05/31/18 140 lb (63.5 kg)    Physical Exam Vitals signs and nursing note reviewed.  Constitutional:      General: She is not in acute distress.    Appearance: She is well-developed.  HENT:     Head: Atraumatic.     Right Ear: External ear normal.     Left Ear: External ear normal.     Nose: Nose normal.     Mouth/Throat:     Pharynx: No oropharyngeal exudate.  Eyes:     General: No scleral icterus.    Conjunctiva/sclera: Conjunctivae normal.     Pupils: Pupils are equal, round, and reactive to light.  Neck:     Musculoskeletal: Normal range of motion and neck supple.     Thyroid: No thyromegaly.  Cardiovascular:     Rate and Rhythm: Normal rate and regular rhythm.     Heart sounds: Normal heart sounds.  Pulmonary:     Effort: Pulmonary effort is normal. No respiratory distress.     Breath  sounds: Normal breath sounds.  Chest:     Breasts:        Right: No mass, skin change or tenderness.        Left: No mass, skin  change or tenderness.  Abdominal:     General: Bowel sounds are normal.     Palpations: Abdomen is soft. There is no mass.     Tenderness: There is no abdominal tenderness.  Genitourinary:    Comments: Deferred Musculoskeletal: Normal range of motion.        General: No tenderness.  Lymphadenopathy:     Cervical: No cervical adenopathy.     Upper Body:     Right upper body: No axillary adenopathy.     Left upper body: No axillary adenopathy.  Skin:    General: Skin is warm and dry.     Findings: No rash.  Neurological:     Mental Status: She is alert and oriented to person, place, and time.     Cranial Nerves: No cranial nerve deficit.  Psychiatric:        Behavior: Behavior normal.     Results for orders placed or performed in visit on 02/21/19  Novel Coronavirus, NAA ([facility])   Specimen: Oropharyngeal(OP) collection in vial transport medium   OROPHARYNGEA  TESTING  Result Value Ref Range   SARS-CoV-2, NAA Detected (A) Not Detected      Assessment & Plan:   Problem List Items Addressed This Visit      Cardiovascular and Mediastinum   Migraine    Having frequent migraines, but hoping this is situational. Will hold off on adding a preventative per patient preference. Continue imitrex prn and f/u if frequency persists      Relevant Medications   SUMAtriptan (IMITREX) 100 MG tablet     Other   Glaucoma    Working with specialist now, will hopefully start treatment next month.        Other Visit Diagnoses    Annual physical exam    -  Primary   Relevant Orders   CBC with Differential/Platelet out   Comprehensive metabolic panel   Lipid Panel w/o Chol/HDL Ratio out   TSH   UA/M w/rflx Culture, Routine       Follow up plan: Return in about 1 year (around 04/14/2020) for CPE.   LABORATORY TESTING:  - Pap smear: not applicable   IMMUNIZATIONS:   - Tdap: Tetanus vaccination status reviewed: last tetanus booster within 10 years. - Influenza: Up to date  SCREENING: -Mammogram: scheduled  - Colonoscopy: scheduled   PATIENT COUNSELING:   Advised to take 1 mg of folate supplement per day if capable of pregnancy.   Sexuality: Discussed sexually transmitted diseases, partner selection, use of condoms, avoidance of unintended pregnancy  and contraceptive alternatives.   Advised to avoid cigarette smoking.  I discussed with the patient that most people either abstain from alcohol or drink within safe limits (<=14/week and <=4 drinks/occasion for males, <=7/weeks and <= 3 drinks/occasion for females) and that the risk for alcohol disorders and other health effects rises proportionally with the number of drinks per week and how often a drinker exceeds daily limits.  Discussed cessation/primary prevention of drug use and availability of treatment for abuse.   Diet: Encouraged to adjust caloric intake to maintain  or achieve ideal body weight, to reduce intake of dietary saturated fat and total fat, to limit sodium intake by avoiding high sodium foods and not adding table salt, and to maintain adequate dietary potassium and calcium preferably from fresh fruits, vegetables, and low-fat dairy products.    stressed the importance of regular exercise  Injury prevention: Discussed safety belts, safety helmets, smoke detector, smoking near bedding or  upholstery.   Dental health: Discussed importance of regular tooth brushing, flossing, and dental visits.    NEXT PREVENTATIVE PHYSICAL DUE IN 1 YEAR. Return in about 1 year (around 04/14/2020) for CPE.

## 2019-04-15 NOTE — Assessment & Plan Note (Signed)
Working with specialist now, will hopefully start treatment next month.

## 2019-04-15 NOTE — Assessment & Plan Note (Signed)
Having frequent migraines, but hoping this is situational. Will hold off on adding a preventative per patient preference. Continue imitrex prn and f/u if frequency persists

## 2019-04-16 LAB — COMPREHENSIVE METABOLIC PANEL
ALT: 11 IU/L (ref 0–32)
AST: 12 IU/L (ref 0–40)
Albumin/Globulin Ratio: 2 (ref 1.2–2.2)
Albumin: 4.5 g/dL (ref 3.8–4.9)
Alkaline Phosphatase: 62 IU/L (ref 39–117)
BUN/Creatinine Ratio: 14 (ref 9–23)
BUN: 12 mg/dL (ref 6–24)
Bilirubin Total: 0.4 mg/dL (ref 0.0–1.2)
CO2: 24 mmol/L (ref 20–29)
Calcium: 9.1 mg/dL (ref 8.7–10.2)
Chloride: 105 mmol/L (ref 96–106)
Creatinine, Ser: 0.88 mg/dL (ref 0.57–1.00)
GFR calc Af Amer: 88 mL/min/{1.73_m2} (ref >59)
GFR calc non Af Amer: 76 mL/min/{1.73_m2} (ref >59)
Globulin, Total: 2.2 g/dL (ref 1.5–4.5)
Glucose: 88 mg/dL (ref 65–99)
Potassium: 3.9 mmol/L (ref 3.5–5.2)
Sodium: 143 mmol/L (ref 134–144)
Total Protein: 6.7 g/dL (ref 6.0–8.5)

## 2019-04-16 LAB — CBC WITH DIFFERENTIAL/PLATELET
Basophils Absolute: 0 10*3/uL (ref 0.0–0.2)
Basos: 1 %
EOS (ABSOLUTE): 0 10*3/uL (ref 0.0–0.4)
Eos: 1 %
Hematocrit: 42 % (ref 34.0–46.6)
Hemoglobin: 14.6 g/dL (ref 11.1–15.9)
Immature Grans (Abs): 0 10*3/uL (ref 0.0–0.1)
Immature Granulocytes: 0 %
Lymphocytes Absolute: 2.1 10*3/uL (ref 0.7–3.1)
Lymphs: 34 %
MCH: 30.5 pg (ref 26.6–33.0)
MCHC: 34.8 g/dL (ref 31.5–35.7)
MCV: 88 fL (ref 79–97)
Monocytes Absolute: 0.4 10*3/uL (ref 0.1–0.9)
Monocytes: 7 %
Neutrophils Absolute: 3.5 10*3/uL (ref 1.4–7.0)
Neutrophils: 57 %
Platelets: 233 10*3/uL (ref 150–450)
RBC: 4.79 x10E6/uL (ref 3.77–5.28)
RDW: 13.1 % (ref 11.7–15.4)
WBC: 6 10*3/uL (ref 3.4–10.8)

## 2019-04-16 LAB — LIPID PANEL W/O CHOL/HDL RATIO
Cholesterol, Total: 144 mg/dL (ref 100–199)
HDL: 58 mg/dL (ref >39)
LDL Chol Calc (NIH): 69 mg/dL (ref 0–99)
Triglycerides: 93 mg/dL (ref 0–149)
VLDL Cholesterol Cal: 17 mg/dL (ref 5–40)

## 2019-04-16 LAB — TSH: TSH: 2.87 u[IU]/mL (ref 0.450–4.500)

## 2019-04-22 ENCOUNTER — Telehealth: Payer: Self-pay | Admitting: Gastroenterology

## 2019-04-22 NOTE — Telephone Encounter (Signed)
Pt left vm stating she has a procedure on Friday and has not received her instructions rx or what to eat and drink please call pt

## 2019-04-22 NOTE — Telephone Encounter (Signed)
Returned patients call regarding instructions, and COVID TEST time.  Instructions have been sent to her personal email.  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  I told her I would check with Pre-Admit testing in the am and call her back to let her know if she can do 2:30 instead.  Thanks [employer]

## 2019-04-22 NOTE — Telephone Encounter (Signed)
Pt left vm regarding her test before her procedure she is supposed to do

## 2019-04-23 ENCOUNTER — Other Ambulatory Visit: Admission: RE | Admit: 2019-04-23 | Payer: BC Managed Care – PPO | Source: Ambulatory Visit

## 2019-04-23 ENCOUNTER — Telehealth: Payer: Self-pay

## 2019-04-23 NOTE — Anesthesia Preprocedure Evaluation (Addendum)
Anesthesia Evaluation  Patient identified by MRN, date of birth, ID band Patient awake    Reviewed: Allergy & Precautions, NPO status , Patient's Chart, lab work & pertinent test results  History of Anesthesia Complications Negative for: history of anesthetic complications  Airway Mallampati: II  TM Distance: >3 FB Neck ROM: Full    Dental  (+) Partial Lower   Pulmonary    breath sounds clear to auscultation       Cardiovascular (-) angina(-) DOE  Rhythm:Regular Rate:Normal     Neuro/Psych  Headaches,    GI/Hepatic neg GERD  ,  Endo/Other    Renal/GU      Musculoskeletal   Abdominal   Peds  Hematology   Anesthesia Other Findings   Reproductive/Obstetrics                            Anesthesia Physical Anesthesia Plan  ASA: I  Anesthesia Plan: General   Post-op Pain Management:    Induction: Intravenous  PONV Risk Score and Plan: 3 and Propofol infusion, TIVA and Treatment may vary due to age or medical condition  Airway Management Planned: Natural Airway and Nasal Cannula  Additional Equipment:   Intra-op Plan:   Post-operative Plan:   Informed Consent: I have reviewed the patients History and Physical, chart, labs and discussed the procedure including the risks, benefits and alternatives for the proposed anesthesia with the patient or authorized representative who has indicated his/her understanding and acceptance.       Plan Discussed with: CRNA and Anesthesiologist  Anesthesia Plan Comments:         Anesthesia Quick Evaluation

## 2019-04-23 NOTE — Telephone Encounter (Signed)
Spoke to [place] at [employer], asked her if it would be okay for her to go for COVID test at 2:30 today due to work.  Cheryl Dawson said that this would be okay.  Thanks  [employer]

## 2019-04-23 NOTE — Pre-Procedure Instructions (Signed)
Patient arrived for covid testing and declared that she was positive on 02/21/19. According to the [facility] swabbing policy, she did not need to be reswabbed for 90 days.

## 2019-04-24 ENCOUNTER — Telehealth: Payer: Self-pay

## 2019-04-24 NOTE — Discharge Instructions (Signed)

## 2019-04-24 NOTE — Telephone Encounter (Signed)
Patient went for COVID test  Yesterday but they informed her that she did not have to do the COVID test because patient tested positive for COVID back in October. Informed [facility].

## 2019-04-26 ENCOUNTER — Ambulatory Visit: Payer: BC Managed Care – PPO | Admitting: Anesthesiology

## 2019-04-26 ENCOUNTER — Other Ambulatory Visit: Payer: Self-pay

## 2019-04-26 ENCOUNTER — Encounter
Admission: RE | Disposition: A | Discharge status: Home / Self Care | Payer: Self-pay | Source: Home / Self Care | Attending: Gastroenterology

## 2019-04-26 ENCOUNTER — Ambulatory Visit
Admission: RE | Admit: 2019-04-26 | Discharge: 2019-04-26 | Disposition: A | Discharge status: Home / Self Care | Payer: BC Managed Care – PPO | Attending: Gastroenterology | Admitting: Gastroenterology

## 2019-04-26 DIAGNOSIS — Z882 Allergy status to sulfonamides status: Secondary | ICD-10-CM | POA: Insufficient documentation

## 2019-04-26 DIAGNOSIS — K648 Other hemorrhoids: Secondary | ICD-10-CM | POA: Insufficient documentation

## 2019-04-26 DIAGNOSIS — G43909 Migraine, unspecified, not intractable, without status migrainosus: Secondary | ICD-10-CM | POA: Insufficient documentation

## 2019-04-26 DIAGNOSIS — Z1211 Encounter for screening for malignant neoplasm of colon: Secondary | ICD-10-CM

## 2019-04-26 HISTORY — PX: COLONOSCOPY WITH PROPOFOL: SHX5780

## 2019-04-26 SURGERY — COLONOSCOPY WITH PROPOFOL
Anesthesia: General

## 2019-04-26 MED ORDER — PROPOFOL 10 MG/ML IV BOLUS
INTRAVENOUS | Status: DC | PRN
  Administered 2019-04-26: 100 mg via INTRAVENOUS

## 2019-04-26 MED ORDER — ONDANSETRON HCL 4 MG/2ML IJ SOLN: 4.0000 mg | Freq: Once | INTRAMUSCULAR | Status: DC | PRN

## 2019-04-26 MED ORDER — ACETAMINOPHEN 10 MG/ML IV SOLN: 1000.0000 mg | Freq: Once | INTRAVENOUS | Status: DC | PRN

## 2019-04-26 MED ORDER — LIDOCAINE HCL (CARDIAC) PF 100 MG/5ML IV SOSY
PREFILLED_SYRINGE | INTRAVENOUS | Status: DC | PRN
  Administered 2019-04-26: 30 mg via INTRAVENOUS

## 2019-04-26 MED ORDER — LACTATED RINGERS IV SOLN
100.0000 mL/h | INTRAVENOUS | Status: DC
  Administered 2019-04-26: 100 mL/h via INTRAVENOUS

## 2019-04-26 MED ORDER — STERILE WATER FOR IRRIGATION IR SOLN
Status: DC | PRN
  Administered 2019-04-26: 50 mL

## 2019-04-26 SURGICAL SUPPLY — 5 items
CANISTER SUCT 1200ML W/VALVE (MISCELLANEOUS) ×3 IMPLANT
GOWN CVR UNV OPN BCK APRN NK (MISCELLANEOUS) ×2 IMPLANT
GOWN ISOL THUMB LOOP REG UNIV (MISCELLANEOUS) ×4
KIT ENDO PROCEDURE OLY (KITS) ×3 IMPLANT
WATER STERILE IRR 250ML POUR (IV SOLUTION) ×3 IMPLANT

## 2019-04-26 NOTE — Anesthesia Postprocedure Evaluation (Signed)
Anesthesia Post Note  Patient: Cheryl Dawson  Procedure(s) Performed: COLONOSCOPY WITH PROPOFOL (N/A )     Patient location during evaluation: PACU Anesthesia Type: General Level of consciousness: awake and alert Pain management: pain level controlled Vital Signs Assessment: post-procedure vital signs reviewed and stable Respiratory status: spontaneous breathing, nonlabored ventilation, respiratory function stable and patient connected to nasal cannula oxygen Cardiovascular status: blood pressure returned to baseline and stable Postop Assessment: no apparent nausea or vomiting Anesthetic complications: no    Markel Kurtenbach A  Anita Mcadory

## 2019-04-26 NOTE — Transfer of Care (Signed)
Immediate Anesthesia Transfer of Care Note  Patient: Cheryl Dawson  Procedure(s) Performed: COLONOSCOPY WITH PROPOFOL (N/A )  Patient Location: PACU  Anesthesia Type: General  Level of Consciousness: awake, alert  and patient cooperative  Airway and Oxygen Therapy: Patient Spontanous Breathing and Patient connected to supplemental oxygen  Post-op Assessment: Post-op Vital signs reviewed, Patient's Cardiovascular Status Stable, Respiratory Function Stable, Patent Airway and No signs of Nausea or vomiting  Post-op Vital Signs: Reviewed and stable  Complications: No apparent anesthesia complications

## 2019-04-26 NOTE — Anesthesia Procedure Notes (Signed)
Procedure Name: General with mask airway Performed by: Izetta Dakin, CRNA Pre-anesthesia Checklist: Timeout performed, Patient being monitored, Suction available, Emergency Drugs available and Patient identified Patient Re-evaluated:Patient Re-evaluated prior to induction Oxygen Delivery Method: Nasal cannula

## 2019-04-26 NOTE — Op Note (Signed)
[facility] Gastroenterology Patient Name: Cheryl Dawson Procedure Date: 04/26/2019 9:53 AM MRN: [id number] Account #: [id number] Date of Birth: 07-11-1965 Admit Type: Outpatient Age: 53 Room: [facility] OR ROOM 01 Gender: Female Note Status: Finalized Procedure:             Colonoscopy Indications:           Screening for colorectal malignant neoplasm Providers:             Darnisha Vernet B. Bonna Gains MD, MD Referring MD:          Guadalupe Maple, MD (Referring MD) Medicines:             Monitored Anesthesia Care Complications:         No immediate complications. Procedure:             Pre-Anesthesia Assessment:                        - Prior to the procedure, a History and Physical was                         performed, and patient medications, allergies and                         sensitivities were reviewed. The patient's tolerance                         of previous anesthesia was reviewed.                        - The risks and benefits of the procedure and the                         sedation options and risks were discussed with the                         patient. All questions were answered and informed                         consent was obtained.                        - Patient identification and proposed procedure were                         verified prior to the procedure by the physician, the                         nurse, the anesthetist and the technician. The                         procedure was verified in the pre-procedure area in                         the procedure room in the endoscopy suite.                        - ASA Grade Assessment: II - A patient with mild  systemic disease.                        - After reviewing the risks and benefits, the patient                         was deemed in satisfactory condition to undergo the                         procedure.                        After obtaining informed consent, the  colonoscope was                         passed under direct vision. Throughout the procedure,                         the patient's blood pressure, pulse, and oxygen                         saturations were monitored continuously. The was                         introduced through the anus and advanced to the the                         cecum, identified by appendiceal orifice and ileocecal                         valve. The colonoscopy was performed with ease. The                         patient tolerated the procedure well. The quality of                         the bowel preparation was good. Findings:      The perianal and digital rectal examinations were normal.      The rectum, sigmoid colon, descending colon, transverse colon, ascending       colon and cecum appeared normal.      Non-bleeding internal hemorrhoids were found during retroflexion. Impression:            - The rectum, sigmoid colon, descending colon,                         transverse colon, ascending colon and cecum are normal.                        - Non-bleeding internal hemorrhoids.                        - No specimens collected. Recommendation:        - Discharge patient to home.                        - Resume previous diet.                        - Continue present medications.                        -  Repeat colonoscopy in 10 years for screening                         purposes.                        - Return to primary care physician as previously                         scheduled.                        - The findings and recommendations were discussed with                         the patient.                        - The findings and recommendations were discussed with                         the patient's family.                        - In the future, if patient develops new symptoms such                         as blood per rectum, abdominal pain, weight loss,                         altered bowel  habits or any other reason for concern,                         patient should discuss this with thier PCP as they may                         need a GI referral at that time or evaluation for need                         for colonoscopy earlier than the recommended screening                         colonoscopy.                        In addition, if patient's family history of colon                         cancer changes (no family history at this time) in the                         future, earlier screening may be indicated and patient                         should discuss this with PCP as well. Procedure Code(s):     --- Professional ---                        [id number], Colorectal cancer screening; colonoscopy on  individual not meeting criteria for high risk Diagnosis Code(s):     --- Professional ---                        Z12.11, Encounter for screening for malignant neoplasm                         of colon                        K64.8, Other hemorrhoids CPT copyright 2019 American Medical Association. All rights reserved. The codes documented in this report are preliminary and upon coder review may  be revised to meet current compliance requirements.  Vonda Antigua, MD Margretta Sidle B. Bonna Gains MD, MD 04/26/2019 10:20:28 AM This report has been signed electronically. Number of Addenda: 0 Note Initiated On: 04/26/2019 9:53 AM Scope Withdrawal Time: 0 hours 10 minutes 15 seconds  Total Procedure Duration: 0 hours 18 minutes 12 seconds       [facility]

## 2019-04-26 NOTE — H&P (Signed)
Vonda Antigua, MD [address] [place], [place], [place], [address] [id number], [place], [place], [place], [address] Phone: [phone]  Fax: [phone]  Primary Care Physician:  Guadalupe Maple, MD   Pre-Procedure History & Physical: HPI:  Cheryl Dawson is a 53 y.o. female is here for a colonoscopy.   Past Medical History:  Diagnosis Date  . Migraine   . Undiagnosed cardiac murmurs   . Uterine fibroids affecting pregnancy     Past Surgical History:  Procedure Laterality Date  . ABDOMINAL HYSTERECTOMY    . CESAREAN SECTION     x2  . DOPPLER ECHOCARDIOGRAPHY  2006  . tubes reversed      Prior to Admission medications   Medication Sig Start Date End Date Taking? Authorizing Provider  clobetasol ointment (TEMOVATE) [id number] % Apply 1 application topically 2 (two) times daily. 05/31/18  Yes Volney American, PA-C  SUMAtriptan (IMITREX) 100 MG tablet Take 1 tablet (100 mg total) by mouth every 2 (two) hours as needed for migraine (max 2 a day). May repeat in 2 hours if headache persists 04/15/19  Yes Volney American, [place]    Allergies as of 03/27/2019 - Review Complete 03/08/2019  Allergen Reaction Noted  . Sulfa antibiotics Rash 03/17/2015    Family History  Problem Relation Age of Onset  . Aneurysm Mother   . Diabetes Father   . Heart disease Sister   . Heart disease Brother   . Stroke Brother   . Breast cancer Maternal Aunt     Social History   Socioeconomic History  . Marital status: Married    Spouse name: Not on file  . Number of children: Not on file  . Years of education: Not on file  . Highest education level: Not on file  Occupational History  . Not on file  Social Needs  . Financial resource strain: Not on file  . Food insecurity    Worry: Not on file    Inability: Not on file  . Transportation needs    Medical: Not on file    Non-medical: Not on file  Tobacco Use  . Smoking status: Never Smoker  . Smokeless tobacco:  Never Used  Substance and Sexual Activity  . Alcohol use: Yes    Alcohol/week: 0.0 standard drinks    Comment: rare  . Drug use: No  . Sexual activity: Not on file  Lifestyle  . Physical activity    Days per week: Not on file    Minutes per session: Not on file  . Stress: Not on file  Relationships  . Social Herbalist on phone: Not on file    Gets together: Not on file    Attends religious service: Not on file    Active member of club or organization: Not on file    Attends meetings of clubs or organizations: Not on file    Relationship status: Not on file  . Intimate partner violence    Fear of current or ex partner: Not on file    Emotionally abused: Not on file    Physically abused: Not on file    Forced sexual activity: Not on file  Other Topics Concern  . Not on file  Social History Narrative  . Not on file    Review of Systems: See HPI, otherwise negative ROS  Physical Exam: BP 135/88   Pulse 69   Temp 98.1 F (36.7 C) (Temporal)   Resp 16  Ht 5\' 3"  (1.6 m)   Wt 62.6 kg   SpO2 100%   BMI 24.45 kg/m  General:   Alert,  pleasant and cooperative in NAD Head:  Normocephalic and atraumatic. Neck:  Supple; no masses or thyromegaly. Lungs:  Clear throughout to auscultation, normal respiratory effort.    Heart:  +S1, +S2, Regular rate and rhythm, No edema. Abdomen:  Soft, nontender and nondistended. Normal bowel sounds, without guarding, and without rebound.   Neurologic:  Alert and  oriented x4;  grossly normal neurologically.  Impression/Plan: Cheryl Dawson is here for a colonoscopy to be performed for average risk screening.  Risks, benefits, limitations, and alternatives regarding  colonoscopy have been reviewed with the patient.  Questions have been answered.  All parties agreeable.   Virgel Manifold, MD  04/26/2019, 8:59 AM

## 2019-04-29 ENCOUNTER — Encounter: Payer: Self-pay | Admitting: Gastroenterology

## 2019-07-04 ENCOUNTER — Ambulatory Visit
Admission: RE | Admit: 2019-07-04 | Discharge: 2019-07-04 | Disposition: A | Discharge status: Home / Self Care | Payer: BC Managed Care – PPO | Source: Ambulatory Visit | Attending: Family Medicine | Admitting: Family Medicine

## 2019-07-04 DIAGNOSIS — Z1231 Encounter for screening mammogram for malignant neoplasm of breast: Secondary | ICD-10-CM | POA: Diagnosis present

## 2019-07-20 ENCOUNTER — Ambulatory Visit: Payer: BC Managed Care – PPO

## 2019-07-21 ENCOUNTER — Ambulatory Visit: Payer: BC Managed Care – PPO | Attending: Internal Medicine

## 2019-07-21 DIAGNOSIS — Z23 Encounter for immunization: Secondary | ICD-10-CM

## 2019-07-21 NOTE — Progress Notes (Signed)
   Covid-19 Vaccination Clinic  Name:  Cheryl Dawson    MRN: [id number] DOB: [birth year]  07/21/2019  [name] was observed post Covid-19 immunization for 15 minutes without incidence. She was provided with Vaccine Information Sheet and instruction to access the V-Safe system.   [name] was instructed to call 911 with any severe reactions post vaccine: Marland Kitchen Difficulty breathing  . Swelling of your face and throat  . A fast heartbeat  . A bad rash all over your body  . Dizziness and weakness    Immunizations Administered    Name Date Dose VIS Date Route   Pfizer COVID-19 Vaccine 07/21/2019  3:03 PM 0.3 mL 05/03/2019 Intramuscular   Manufacturer: [place]   Lot: [id number]   [place]: [id number]

## 2019-08-14 ENCOUNTER — Ambulatory Visit: Payer: BC Managed Care – PPO | Attending: Internal Medicine

## 2019-08-14 DIAGNOSIS — Z23 Encounter for immunization: Secondary | ICD-10-CM

## 2019-08-14 NOTE — Progress Notes (Signed)
   Covid-19 Vaccination Clinic  Name:  GEAN STEFANOWICZ    MRN: [id number] DOB: [birth year]  08/14/2019  Ms. Pointer was observed post Covid-19 immunization for 15 minutes without incident. She was provided with Vaccine Information Sheet and instruction to access the V-Safe system.   Ms. Stough was instructed to call 911 with any severe reactions post vaccine: Marland Kitchen Difficulty breathing  . Swelling of face and throat  . A fast heartbeat  . A bad rash all over body  . Dizziness and weakness   Immunizations Administered    Name Date Dose VIS Date Route   Pfizer COVID-19 Vaccine 08/14/2019  2:21 PM 0.3 mL 05/03/2019 Intramuscular   Manufacturer: [employer]   Lot: [id number]   [place]: [id number]

## 2019-08-17 ENCOUNTER — Ambulatory Visit: Payer: BC Managed Care – PPO

## 2019-08-20 ENCOUNTER — Ambulatory Visit: Payer: BC Managed Care – PPO

## 2020-03-10 ENCOUNTER — Encounter: Payer: Self-pay | Admitting: Family Medicine

## 2020-03-10 ENCOUNTER — Other Ambulatory Visit: Payer: Self-pay

## 2020-03-10 ENCOUNTER — Ambulatory Visit (INDEPENDENT_AMBULATORY_CARE_PROVIDER_SITE_OTHER): Payer: BC Managed Care – PPO | Admitting: Family Medicine

## 2020-03-10 VITALS — BP 137/76 | HR 64 | Temp 98.6°F | Ht 63.0 in | Wt 144.0 lb

## 2020-03-10 DIAGNOSIS — H1131 Conjunctival hemorrhage, right eye: Secondary | ICD-10-CM

## 2020-03-10 MED ORDER — ERYTHROMYCIN 5 MG/GM OP OINT: 1.0000 "application " | TOPICAL_OINTMENT | Freq: Three times a day (TID) | OPHTHALMIC | 0 refills | Status: DC

## 2020-03-10 NOTE — Progress Notes (Signed)
BP 137/76   Pulse 64   Temp 98.6 F (37 C) (Oral)   Ht 5\' 3"  (1.6 m)   Wt 144 lb (65.3 kg)   SpO2 99%   BMI 25.51 kg/m    Subjective:    Patient ID: Cheryl Dawson, female    DOB: 08-27-1965, 54 y.o.   MRN: [id number]  HPI: Cheryl Dawson is a 54 y.o. female  Chief Complaint  Patient presents with  . Eye Problem    X 1 month - used OTC counter red eye drops wiht no relief. No pain but very red. No injury she knows of. 20/25 both eyes, 20/25 right eye, 20/40 left eye   EYE REDNESS Duration:  About a month Involved eye:  right Onset: sudden Severity: no pain  Foreign body sensation:no Visual impairment: no Eye redness: yes Discharge: no Crusting or matting of eyelids: no Swelling: no Photophobia: no Itching: no Tearing: no Headache: no Floaters: no URI symptoms: no Contact lens use: no Close contacts with similar problems: no Eye trauma: no Status: better  Relevant past medical, surgical, family and social history reviewed and updated as indicated. Interim medical history since our last visit reviewed. Allergies and medications reviewed and updated.  Review of Systems  Constitutional: Negative.   HENT: Negative.   Eyes: Positive for redness. Negative for photophobia, pain, discharge, itching and visual disturbance.  Respiratory: Negative.   Cardiovascular: Negative.   Musculoskeletal: Negative.   Psychiatric/Behavioral: Negative.     Per HPI unless specifically indicated above     Objective:    BP 137/76   Pulse 64   Temp 98.6 F (37 C) (Oral)   Ht 5\' 3"  (1.6 m)   Wt 144 lb (65.3 kg)   SpO2 99%   BMI 25.51 kg/m   Wt Readings from Last 3 Encounters:  03/10/20 144 lb (65.3 kg)  04/26/19 138 lb (62.6 kg)  04/15/19 144 lb (65.3 kg)     Visual Acuity Screening   Right eye Left eye Both eyes  Without correction: 20/25 20/40 20/25   With correction:       Physical Exam Vitals and nursing note reviewed.  Constitutional:      General: She is  not in acute distress.    Appearance: Normal appearance. She is not ill-appearing, toxic-appearing or diaphoretic.  HENT:     Head: Normocephalic and atraumatic.     Right Ear: External ear normal.     Left Ear: External ear normal.     Nose: Nose normal.     Mouth/Throat:     Mouth: Mucous membranes are moist.     Pharynx: Oropharynx is clear.  Eyes:     General: No scleral icterus.       Right eye: No discharge.        Left eye: No discharge.     Extraocular Movements: Extraocular movements intact.     Conjunctiva/sclera: Conjunctivae normal.     Pupils: Pupils are equal, round, and reactive to light.     Comments: Redness on R medial corner of her eye  Cardiovascular:     Rate and Rhythm: Normal rate and regular rhythm.     Pulses: Normal pulses.     Heart sounds: Normal heart sounds. No murmur heard.  No friction rub. No gallop.   Pulmonary:     Effort: Pulmonary effort is normal. No respiratory distress.     Breath sounds: Normal breath sounds. No stridor. No wheezing, rhonchi or rales.  Chest:     Chest wall: No tenderness.  Musculoskeletal:        General: Normal range of motion.     Cervical back: Normal range of motion and neck supple.  Skin:    General: Skin is warm and dry.     Capillary Refill: Capillary refill takes less than 2 seconds.     Coloration: Skin is not jaundiced or pale.     Findings: No bruising, erythema, lesion or rash.  Neurological:     General: No focal deficit present.     Mental Status: She is alert and oriented to person, place, and time. Mental status is at baseline.  Psychiatric:        Mood and Affect: Mood normal.        Behavior: Behavior normal.        Thought Content: Thought content normal.        Judgment: Judgment normal.     Results for orders placed or performed in visit on 04/15/19  Microscopic Examination   URINE  Result Value Ref Range   WBC, UA None seen 0 - 5 /hpf   RBC 0-2 0 - 2 /hpf   Epithelial Cells (non  renal) 0-10 0 - 10 /hpf   Bacteria, UA Few (A) None seen/Few  CBC with Differential/Platelet out  Result Value Ref Range   WBC 6.0 3.4 - 10.8 x10E3/uL   RBC 4.79 3.77 - 5.28 x10E6/uL   Hemoglobin 14.6 11.1 - 15.9 g/dL   Hematocrit 42.0 34.0 - 46.6 %   MCV 88 79 - 97 fL   MCH 30.5 26.6 - 33.0 pg   MCHC 34.8 31 - 35 g/dL   RDW 13.1 11.7 - 15.4 %   Platelets 233 150 - 450 x10E3/uL   Neutrophils 57 Not Estab. %   Lymphs 34 Not Estab. %   Monocytes 7 Not Estab. %   Eos 1 Not Estab. %   Basos 1 Not Estab. %   Neutrophils Absolute 3.5 1.40 - 7.00 x10E3/uL   Lymphocytes Absolute 2.1 0 - 3 x10E3/uL   Monocytes Absolute 0.4 0 - 0 x10E3/uL   EOS (ABSOLUTE) 0.0 0.0 - 0.4 x10E3/uL   Basophils Absolute 0.0 0 - 0 x10E3/uL   Immature Granulocytes 0 Not Estab. %   Immature Grans (Abs) 0.0 0.0 - 0.1 x10E3/uL  Comprehensive metabolic panel  Result Value Ref Range   Glucose 88 65 - 99 mg/dL   BUN 12 6 - 24 mg/dL   Creatinine, Ser 0.88 0.57 - 1.00 mg/dL   GFR calc non Af Amer 76 >59 mL/min/1.73   GFR calc Af Amer 88 >59 mL/min/1.73   BUN/Creatinine Ratio 14 9 - 23   Sodium 143 134 - 144 mmol/L   Potassium 3.9 3.5 - 5.2 mmol/L   Chloride 105 96 - 106 mmol/L   CO2 24 20 - 29 mmol/L   Calcium 9.1 8.7 - 10.2 mg/dL   Total Protein 6.7 6.0 - 8.5 g/dL   Albumin 4.5 3.8 - 4.9 g/dL   Globulin, Total 2.2 1.5 - 4.5 g/dL   Albumin/Globulin Ratio 2.0 1.2 - 2.2   Bilirubin Total 0.4 0.0 - 1.2 mg/dL   Alkaline Phosphatase 62 39 - 117 IU/L   AST 12 0 - 40 IU/L   ALT 11 0 - 32 IU/L  Lipid Panel w/o Chol/HDL Ratio out  Result Value Ref Range   Cholesterol, Total 144 100 - 199 mg/dL   Triglycerides 93 0 - 149  mg/dL   HDL 58 >39 mg/dL   VLDL Cholesterol Cal 17 5 - 40 mg/dL   LDL Chol Calc (NIH) 69 0 - 99 mg/dL  TSH  Result Value Ref Range   TSH 2.870 0.450 - 4.500 uIU/mL  UA/M w/rflx Culture, Routine   Specimen: Urine   URINE  Result Value Ref Range   Specific Gravity, UA 1.025 1.005 - 1.030    pH, UA 5.5 5.0 - 7.5   Color, UA Yellow Yellow   Appearance Ur Clear Clear   Leukocytes,UA Negative Negative   Protein,UA Negative Negative/Trace   Glucose, UA Negative Negative   Ketones, UA Negative Negative   RBC, UA Trace (A) Negative   Bilirubin, UA Negative Negative   Urobilinogen, Ur 0.2 0.2 - 1.0 mg/dL   Nitrite, UA Negative Negative   Microscopic Examination See below:       Assessment & Plan:   Problem List Items Addressed This Visit    None    Visit Diagnoses    Subconjunctival hemorrhage of right eye    -  Primary   Since it's been going on for 1 month, will check CBC. Treat with compresses and erythromycin ointment for ?scratch. If not better in 2 weeks eye doctor.    Relevant Orders   CBC with Differential/Platelet       Follow up plan: No follow-ups on file.

## 2020-03-11 LAB — CBC WITH DIFFERENTIAL/PLATELET
Basophils Absolute: 0 10*3/uL (ref 0.0–0.2)
Basos: 1 %
EOS (ABSOLUTE): 0 10*3/uL (ref 0.0–0.4)
Eos: 1 %
Hematocrit: 42.4 % (ref 34.0–46.6)
Hemoglobin: 14.3 g/dL (ref 11.1–15.9)
Immature Grans (Abs): 0 10*3/uL (ref 0.0–0.1)
Immature Granulocytes: 0 %
Lymphocytes Absolute: 1.4 10*3/uL (ref 0.7–3.1)
Lymphs: 33 %
MCH: 30.1 pg (ref 26.6–33.0)
MCHC: 33.7 g/dL (ref 31.5–35.7)
MCV: 89 fL (ref 79–97)
Monocytes Absolute: 0.3 10*3/uL (ref 0.1–0.9)
Monocytes: 6 %
Neutrophils Absolute: 2.5 10*3/uL (ref 1.4–7.0)
Neutrophils: 59 %
Platelets: 232 10*3/uL (ref 150–450)
RBC: 4.75 x10E6/uL (ref 3.77–5.28)
RDW: 12.1 % (ref 11.7–15.4)
WBC: 4.2 10*3/uL (ref 3.4–10.8)

## 2020-04-15 ENCOUNTER — Other Ambulatory Visit: Payer: Self-pay

## 2020-04-15 ENCOUNTER — Ambulatory Visit: Payer: BC Managed Care – PPO | Admitting: Family Medicine

## 2020-04-15 ENCOUNTER — Encounter: Payer: BC Managed Care – PPO | Admitting: Family Medicine

## 2020-04-15 ENCOUNTER — Encounter: Payer: Self-pay | Admitting: Family Medicine

## 2020-04-15 VITALS — BP 127/83 | HR 57 | Temp 98.3°F | Ht 63.35 in | Wt 138.4 lb

## 2020-04-15 DIAGNOSIS — D485 Neoplasm of uncertain behavior of skin: Secondary | ICD-10-CM | POA: Diagnosis not present

## 2020-04-15 DIAGNOSIS — Z0000 Encounter for general adult medical examination without abnormal findings: Secondary | ICD-10-CM

## 2020-04-15 DIAGNOSIS — Z23 Encounter for immunization: Secondary | ICD-10-CM | POA: Diagnosis not present

## 2020-04-15 LAB — URINALYSIS, ROUTINE W REFLEX MICROSCOPIC
Bilirubin, UA: NEGATIVE
Glucose, UA: NEGATIVE
Ketones, UA: NEGATIVE
Leukocytes,UA: NEGATIVE
Nitrite, UA: NEGATIVE
Protein,UA: NEGATIVE
RBC, UA: NEGATIVE
Specific Gravity, UA: 1.02 (ref 1.005–1.030)
Urobilinogen, Ur: 0.2 mg/dL (ref 0.2–1.0)
pH, UA: 7 (ref 5.0–7.5)

## 2020-04-15 MED ORDER — SUMATRIPTAN SUCCINATE 100 MG PO TABS: 100.0000 mg | ORAL_TABLET | ORAL | 11 refills | Status: DC | PRN

## 2020-04-15 MED ORDER — CLOBETASOL PROPIONATE 0.05 % EX OINT: 1.0000 "application " | TOPICAL_OINTMENT | Freq: Two times a day (BID) | CUTANEOUS | 1 refills | Status: DC

## 2020-04-15 NOTE — Progress Notes (Signed)
BP 127/83   Pulse (!) 57   Temp 98.3 F (36.8 C) (Oral)   Ht 5' 3.35" (1.609 m)   Wt 138 lb 6.4 oz (62.8 kg)   SpO2 100%   BMI 24.25 kg/m    Subjective:    Patient ID: Cheryl Dawson, female    DOB: [birth year], 54 y.o.   MRN: [id number]  HPI: Cheryl Dawson is a 54 y.o. female presenting on 04/15/2020 for comprehensive medical examination. Current medical complaints include:  Migraines are doing well. Uses imitrex occasionally. No concerns.   Menopausal Symptoms: no  Depression Screen done today and results listed below:  Depression screen [facility] 2/9 04/15/2020 03/10/2020 04/15/2019 04/09/2018 03/28/2016  Decreased Interest 0 0 1 2 2   Down, Depressed, Hopeless 0 0 0 2 1  PHQ - 2 Score 0 0 1 4 3   Altered sleeping - 0 0 2 0  Tired, decreased energy - 0 1 3 2   Change in appetite - 0 1 0 2  Feeling bad or failure about yourself  - 0 0 0 0  Trouble concentrating - 0 0 1 0  Moving slowly or fidgety/restless - 0 0 0 1  Suicidal thoughts - 0 0 0 0  PHQ-9 Score - 0 3 10 8   Difficult doing work/chores - Not difficult at all - - -    Past Medical History:  Past Medical History:  Diagnosis Date  . Migraine   . Undiagnosed cardiac murmurs   . Uterine fibroids affecting pregnancy     Surgical History:  Past Surgical History:  Procedure Laterality Date  . ABDOMINAL HYSTERECTOMY    . CESAREAN SECTION     x2  . COLONOSCOPY WITH PROPOFOL N/A 04/26/2019   Procedure: COLONOSCOPY WITH PROPOFOL;  Surgeon: Virgel Manifold, MD;  Location: [place];  Service: Endoscopy;  Laterality: N/A;  . DOPPLER ECHOCARDIOGRAPHY  2006  . tubes reversed      Medications:  Current Outpatient Medications on File Prior to Visit  Medication Sig  . erythromycin ophthalmic ointment Place 1 application into the right eye 3 (three) times daily.   No current facility-administered medications on file prior to visit.    Allergies:  Allergies  Allergen Reactions  . Penicillins Hives  .  Sulfa Antibiotics Rash    Social History:  Social History   Socioeconomic History  . Marital status: Married    Spouse name: Not on file  . Number of children: Not on file  . Years of education: Not on file  . Highest education level: Not on file  Occupational History  . Not on file  Tobacco Use  . Smoking status: Never Smoker  . Smokeless tobacco: Never Used  Vaping Use  . Vaping Use: Never used  Substance and Sexual Activity  . Alcohol use: Yes    Alcohol/week: 0.0 standard drinks    Comment: rare  . Drug use: No  . Sexual activity: Not on file  Other Topics Concern  . Not on file  Social History Narrative  . Not on file   Social Determinants of Health   Financial Resource Strain:   . Difficulty of Paying Living Expenses: Not on file  Food Insecurity:   . Worried About Charity fundraiser in the Last Year: Not on file  . Ran Out of Food in the Last Year: Not on file  Transportation Needs:   . Lack of Transportation (Medical): Not on file  . Lack of Transportation (Non-Medical):  Not on file  Physical Activity:   . Days of Exercise per Week: Not on file  . Minutes of Exercise per Session: Not on file  Stress:   . Feeling of Stress : Not on file  Social Connections:   . Frequency of Communication with Friends and Family: Not on file  . Frequency of Social Gatherings with Friends and Family: Not on file  . Attends Religious Services: Not on file  . Active Member of Clubs or Organizations: Not on file  . Attends Archivist Meetings: Not on file  . Marital Status: Not on file  Intimate Partner Violence:   . Fear of Current or Ex-Partner: Not on file  . Emotionally Abused: Not on file  . Physically Abused: Not on file  . Sexually Abused: Not on file   Social History   Tobacco Use  Smoking Status Never Smoker  Smokeless Tobacco Never Used   Social History   Substance and Sexual Activity  Alcohol Use Yes  . Alcohol/week: 0.0 standard drinks    Comment: rare    Family History:  Family History  Problem Relation Age of Onset  . Aneurysm Mother   . Diabetes Father   . Heart disease Sister   . Heart disease Brother   . Stroke Brother   . Breast cancer Maternal Aunt     Past medical history, surgical history, medications, allergies, family history and social history reviewed with patient today and changes made to appropriate areas of the chart.   Review of Systems  Constitutional: Positive for diaphoresis. Negative for chills, fever, malaise/fatigue and weight loss.  HENT: Negative.   Eyes: Negative.   Respiratory: Negative.   Cardiovascular: Negative.   Gastrointestinal: Negative.   Genitourinary: Negative.   Musculoskeletal: Negative.   Skin: Negative.   Neurological: Negative.   Endo/Heme/Allergies: Negative.   Psychiatric/Behavioral: Negative.     All other ROS negative except what is listed above and in the HPI.      Objective:    BP 127/83   Pulse (!) 57   Temp 98.3 F (36.8 C) (Oral)   Ht 5' 3.35" (1.609 m)   Wt 138 lb 6.4 oz (62.8 kg)   SpO2 100%   BMI 24.25 kg/m   Wt Readings from Last 3 Encounters:  04/15/20 138 lb 6.4 oz (62.8 kg)  03/10/20 144 lb (65.3 kg)  04/26/19 138 lb (62.6 kg)    Physical Exam Vitals and nursing note reviewed.  Constitutional:      General: She is not in acute distress.    Appearance: Normal appearance. She is not ill-appearing, toxic-appearing or diaphoretic.  HENT:     Head: Normocephalic and atraumatic.     Right Ear: Tympanic membrane, ear canal and external ear normal. There is no impacted cerumen.     Left Ear: Tympanic membrane, ear canal and external ear normal. There is no impacted cerumen.     Nose: Nose normal. No congestion or rhinorrhea.     Mouth/Throat:     Mouth: Mucous membranes are moist.     Pharynx: Oropharynx is clear. No oropharyngeal exudate or posterior oropharyngeal erythema.  Eyes:     General: No scleral icterus.       Right eye: No  discharge.        Left eye: No discharge.     Extraocular Movements: Extraocular movements intact.     Conjunctiva/sclera: Conjunctivae normal.     Pupils: Pupils are equal, round, and reactive to light.  Neck:     Vascular: No carotid bruit.  Cardiovascular:     Rate and Rhythm: Normal rate and regular rhythm.     Pulses: Normal pulses.     Heart sounds: No murmur heard.  No friction rub. No gallop.   Pulmonary:     Effort: Pulmonary effort is normal. No respiratory distress.     Breath sounds: Normal breath sounds. No stridor. No wheezing, rhonchi or rales.  Chest:     Chest wall: No tenderness.  Abdominal:     General: Abdomen is flat. Bowel sounds are normal. There is no distension.     Palpations: Abdomen is soft. There is no mass.     Tenderness: There is no abdominal tenderness. There is no right CVA tenderness, left CVA tenderness, guarding or rebound.     Hernia: No hernia is present.  Genitourinary:    Comments: Breast and pelvic exams deferred with shared decision making Musculoskeletal:        General: No swelling, tenderness, deformity or signs of injury.     Cervical back: Normal range of motion and neck supple. No rigidity. No muscular tenderness.     Right lower leg: No edema.     Left lower leg: No edema.  Lymphadenopathy:     Cervical: No cervical adenopathy.  Skin:    General: Skin is warm and dry.     Capillary Refill: Capillary refill takes less than 2 seconds.     Coloration: Skin is not jaundiced or pale.     Findings: No bruising, erythema, lesion or rash.     Comments: Very dark irregular mole on L scapula  Neurological:     General: No focal deficit present.     Mental Status: She is alert and oriented to person, place, and time. Mental status is at baseline.     Cranial Nerves: No cranial nerve deficit.     Sensory: No sensory deficit.     Motor: No weakness.     Coordination: Coordination normal.     Gait: Gait normal.     Deep Tendon Reflexes:  Reflexes normal.  Psychiatric:        Mood and Affect: Mood normal.        Behavior: Behavior normal.        Thought Content: Thought content normal.        Judgment: Judgment normal.     Results for orders placed or performed in visit on 03/10/20  CBC with Differential/Platelet  Result Value Ref Range   WBC 4.2 3.4 - 10.8 x10E3/uL   RBC 4.75 3.77 - 5.28 x10E6/uL   Hemoglobin 14.3 11.1 - 15.9 g/dL   Hematocrit 42.4 34.0 - 46.6 %   MCV 89 79 - 97 fL   MCH 30.1 26.6 - 33.0 pg   MCHC 33.7 31 - 35 g/dL   RDW 12.1 11.7 - 15.4 %   Platelets 232 150 - 450 x10E3/uL   Neutrophils 59 Not Estab. %   Lymphs 33 Not Estab. %   Monocytes 6 Not Estab. %   Eos 1 Not Estab. %   Basos 1 Not Estab. %   Neutrophils Absolute 2.5 1.40 - 7.00 x10E3/uL   Lymphocytes Absolute 1.4 0 - 3 x10E3/uL   Monocytes Absolute 0.3 0 - 0 x10E3/uL   EOS (ABSOLUTE) 0.0 0.0 - 0.4 x10E3/uL   Basophils Absolute 0.0 0 - 0 x10E3/uL   Immature Granulocytes 0 Not Estab. %   Immature Grans (Abs) 0.0 0.0 -  0.1 x10E3/uL      Assessment & Plan:   Problem List Items Addressed This Visit    None    Visit Diagnoses    Routine general medical examination at a health care facility    -  Primary   Vaccines up to date. Screening labs checked today. Pap N/A. Mammogram and colonoscopy up to date. Continue diet and exercise. Call with concerns.    Relevant Orders   CBC with Differential/Platelet   Comprehensive metabolic panel   Lipid Panel w/o Chol/HDL Ratio   TSH   Urinalysis, Routine w reflex microscopic   Hepatitis C Antibody   Neoplasm of uncertain behavior of skin       Will return ASAP for mole removal   Need for influenza vaccination       Flu shot given today.   Relevant Orders   Flu Vaccine QUAD 6+ mos PF IM (Fluarix Quad PF)       Follow up plan: Return ASAP mole removal.   LABORATORY TESTING:  - Pap smear: not applicable  IMMUNIZATIONS:   - Tdap: Tetanus vaccination status reviewed: last tetanus  booster within 10 years. - Influenza: Administered today - Pneumovax: Not applicable - COVID: Up to date  SCREENING: -Mammogram: Up to date  - Colonoscopy: Up to date   PATIENT COUNSELING:   Advised to take 1 mg of folate supplement per day if capable of pregnancy.   Sexuality: Discussed sexually transmitted diseases, partner selection, use of condoms, avoidance of unintended pregnancy  and contraceptive alternatives.   Advised to avoid cigarette smoking.  I discussed with the patient that most people either abstain from alcohol or drink within safe limits (<=14/week and <=4 drinks/occasion for males, <=7/weeks and <= 3 drinks/occasion for females) and that the risk for alcohol disorders and other health effects rises proportionally with the number of drinks per week and how often a drinker exceeds daily limits.  Discussed cessation/primary prevention of drug use and availability of treatment for abuse.   Diet: Encouraged to adjust caloric intake to maintain  or achieve ideal body weight, to reduce intake of dietary saturated fat and total fat, to limit sodium intake by avoiding high sodium foods and not adding table salt, and to maintain adequate dietary potassium and calcium preferably from fresh fruits, vegetables, and low-fat dairy products.    stressed the importance of regular exercise  Injury prevention: Discussed safety belts, safety helmets, smoke detector, smoking near bedding or upholstery.   Dental health: Discussed importance of regular tooth brushing, flossing, and dental visits.    NEXT PREVENTATIVE PHYSICAL DUE IN 1 YEAR. Return ASAP mole removal.

## 2020-04-15 NOTE — Patient Instructions (Addendum)
[place] [employer], Female Adopting a healthy lifestyle and getting preventive care are important in promoting health and wellness. Ask your health care provider about:  The right schedule for you to have regular tests and exams.  Things you can do on your own to prevent diseases and keep yourself healthy. What should I know about diet, weight, and exercise? Eat a healthy diet   Eat a diet that includes plenty of vegetables, fruits, low-fat dairy products, and lean protein.  Do not eat a lot of foods that are high in solid fats, added sugars, or sodium. Maintain a healthy weight Body mass index (BMI) is used to identify weight problems. It estimates body fat based on height and weight. Your health care provider can help determine your BMI and help you achieve or maintain a healthy weight. Get regular exercise Get regular exercise. This is one of the most important things you can do for your health. Most adults should:  Exercise for at least 150 minutes each week. The exercise should increase your heart rate and make you sweat (moderate-intensity exercise).  Do strengthening exercises at least twice a week. This is in addition to the moderate-intensity exercise.  Spend less time sitting. Even light physical activity can be beneficial. Watch cholesterol and blood lipids Have your blood tested for lipids and cholesterol at 52 years of age, then have this test every 5 years. Have your cholesterol levels checked more often if:  Your lipid or cholesterol levels are high.  You are older than 52 years of age.  You are at high risk for heart disease. What should I know about cancer screening? Depending on your health history and family history, you may need to have cancer screening at various ages. This may include screening for:  Breast cancer.  Cervical cancer.  Colorectal cancer.  Skin cancer.  Lung cancer. What should I know  about heart disease, diabetes, and high blood pressure? Blood pressure and heart disease  High blood pressure causes heart disease and increases the risk of stroke. This is more likely to develop in people who have high blood pressure readings, are of African descent, or are overweight.  Have your blood pressure checked: ? Every 3-5 years if you are 58-30 years of age. ? Every year if you are 14 years old or older. Diabetes Have regular diabetes screenings. This checks your fasting blood sugar level. Have the screening done:  Once every three years after age 86 if you are at a normal weight and have a low risk for diabetes.  More often and at a younger age if you are overweight or have a high risk for diabetes. What should I know about preventing infection? Hepatitis B If you have a higher risk for hepatitis B, you should be screened for this virus. Talk with your health care provider to find out if you are at risk for hepatitis B infection. Hepatitis C Testing is recommended for:  Everyone born from [birth year] through 1963.  Anyone with known risk factors for hepatitis C. Sexually transmitted infections (STIs)  Get screened for STIs, including gonorrhea and chlamydia, if: ? You are sexually active and are younger than 52 years of age. ? You are older than 52 years of age and your health care provider tells you that you are at risk for this type of infection. ? Your sexual activity has changed since you were last screened, and you are at increased risk for chlamydia  or gonorrhea. Ask your health care provider if you are at risk.  Ask your health care provider about whether you are at high risk for HIV. Your health care provider may recommend a prescription medicine to help prevent HIV infection. If you choose to take medicine to prevent HIV, you should first get tested for HIV. You should then be tested every 3 months for as long as you are taking the medicine. Pregnancy  If you are about  to stop having your period (premenopausal) and you may become pregnant, seek counseling before you get pregnant.  Take 400 to 800 micrograms (mcg) of folic acid every day if you become pregnant.  Ask for birth control (contraception) if you want to prevent pregnancy. Osteoporosis and menopause Osteoporosis is a disease in which the bones lose minerals and strength with aging. This can result in bone fractures. If you are 47 years old or older, or if you are at risk for osteoporosis and fractures, ask your health care provider if you should:  Be screened for bone loss.  Take a calcium or vitamin D supplement to lower your risk of fractures.  Be given hormone replacement therapy (HRT) to treat symptoms of menopause. Follow these instructions at home: Lifestyle  Do not use any products that contain nicotine or tobacco, such as cigarettes, e-cigarettes, and chewing tobacco. If you need help quitting, ask your health care provider.  Do not use street drugs.  Do not share needles.  Ask your health care provider for help if you need support or information about quitting drugs. Alcohol use  Do not drink alcohol if: ? Your health care provider tells you not to drink. ? You are pregnant, may be pregnant, or are planning to become pregnant.  If you drink alcohol: ? Limit how much you use to 0-1 drink a day. ? Limit intake if you are breastfeeding.  Be aware of how much alcohol is in your drink. In the [place] one drink equals one 12 oz bottle of beer (355 mL), one 5 oz glass of wine (148 mL), or one 1 oz glass of hard liquor (44 mL). General instructions  Schedule regular health, dental, and eye exams.  Stay current with your vaccines.  Tell your health care provider if: ? You often feel depressed. ? You have ever been abused or do not feel safe at home. Summary  Adopting a healthy lifestyle and getting preventive care are important in promoting health and wellness.  Follow your  health care provider's instructions about healthy diet, exercising, and getting tested or screened for diseases.  Follow your health care provider's instructions on monitoring your cholesterol and blood pressure. This information is not intended to replace advice given to you by your health care provider. Make sure you discuss any questions you have with your health care provider. Document Revised: 05/02/2018 Document Reviewed: 05/02/2018 Elsevier Patient Education  2020 [employer].

## 2020-04-16 LAB — COMPREHENSIVE METABOLIC PANEL
ALT: 11 IU/L (ref 0–32)
AST: 8 IU/L (ref 0–40)
Albumin/Globulin Ratio: 2.4 — ABNORMAL HIGH (ref 1.2–2.2)
Albumin: 4.7 g/dL (ref 3.8–4.9)
Alkaline Phosphatase: 67 IU/L (ref 44–121)
BUN/Creatinine Ratio: 18 (ref 9–23)
BUN: 16 mg/dL (ref 6–24)
Bilirubin Total: 0.5 mg/dL (ref 0.0–1.2)
CO2: 24 mmol/L (ref 20–29)
Calcium: 9.4 mg/dL (ref 8.7–10.2)
Chloride: 104 mmol/L (ref 96–106)
Creatinine, Ser: 0.9 mg/dL (ref 0.57–1.00)
GFR calc Af Amer: 85 mL/min/{1.73_m2} (ref >59)
GFR calc non Af Amer: 74 mL/min/{1.73_m2} (ref >59)
Globulin, Total: 2 g/dL (ref 1.5–4.5)
Glucose: 80 mg/dL (ref 65–99)
Potassium: 4.3 mmol/L (ref 3.5–5.2)
Sodium: 141 mmol/L (ref 134–144)
Total Protein: 6.7 g/dL (ref 6.0–8.5)

## 2020-04-16 LAB — LIPID PANEL W/O CHOL/HDL RATIO
Cholesterol, Total: 139 mg/dL (ref 100–199)
HDL: 60 mg/dL (ref >39)
LDL Chol Calc (NIH): 68 mg/dL (ref 0–99)
Triglycerides: 46 mg/dL (ref 0–149)
VLDL Cholesterol Cal: 11 mg/dL (ref 5–40)

## 2020-04-16 LAB — CBC WITH DIFFERENTIAL/PLATELET
Basophils Absolute: 0 10*3/uL (ref 0.0–0.2)
Basos: 1 %
EOS (ABSOLUTE): 0 10*3/uL (ref 0.0–0.4)
Eos: 1 %
Hematocrit: 44 % (ref 34.0–46.6)
Hemoglobin: 15.1 g/dL (ref 11.1–15.9)
Immature Grans (Abs): 0 10*3/uL (ref 0.0–0.1)
Immature Granulocytes: 0 %
Lymphocytes Absolute: 1.5 10*3/uL (ref 0.7–3.1)
Lymphs: 38 %
MCH: 29.6 pg (ref 26.6–33.0)
MCHC: 34.3 g/dL (ref 31.5–35.7)
MCV: 86 fL (ref 79–97)
Monocytes Absolute: 0.3 10*3/uL (ref 0.1–0.9)
Monocytes: 7 %
Neutrophils Absolute: 2.1 10*3/uL (ref 1.4–7.0)
Neutrophils: 53 %
Platelets: 251 10*3/uL (ref 150–450)
RBC: 5.1 x10E6/uL (ref 3.77–5.28)
RDW: 11.8 % (ref 11.7–15.4)
WBC: 4 10*3/uL (ref 3.4–10.8)

## 2020-04-16 LAB — HEPATITIS C ANTIBODY: Hep C Virus Ab: <0.1 s/co ratio (ref 0.0–0.9)

## 2020-04-16 LAB — TSH: TSH: 1.62 u[IU]/mL (ref 0.450–4.500)

## 2020-05-14 ENCOUNTER — Other Ambulatory Visit: Payer: Self-pay

## 2020-05-14 ENCOUNTER — Other Ambulatory Visit (HOSPITAL_COMMUNITY)
Admission: RE | Admit: 2020-05-14 | Discharge: 2020-05-14 | Disposition: A | Discharge status: Home / Self Care | Payer: BC Managed Care – PPO | Source: Ambulatory Visit | Attending: Family Medicine | Admitting: Family Medicine

## 2020-05-14 ENCOUNTER — Ambulatory Visit (INDEPENDENT_AMBULATORY_CARE_PROVIDER_SITE_OTHER): Payer: BC Managed Care – PPO | Admitting: Family Medicine

## 2020-05-14 ENCOUNTER — Encounter: Payer: Self-pay | Admitting: Dermatology

## 2020-05-14 ENCOUNTER — Encounter: Payer: Self-pay | Admitting: Family Medicine

## 2020-05-14 VITALS — BP 129/84 | HR 60 | Temp 98.4°F | Wt 137.8 lb

## 2020-05-14 DIAGNOSIS — D485 Neoplasm of uncertain behavior of skin: Secondary | ICD-10-CM

## 2020-05-14 NOTE — Progress Notes (Signed)
BP 129/84   Pulse 60   Temp 98.4 F (36.9 C)   Wt 137 lb 12.8 oz (62.5 kg)   SpO2 99%   BMI 24.14 kg/m    Subjective:    Patient ID: Cheryl Dawson, female    DOB: 1966/01/15, 54 y.o.   MRN: [id number]  HPI: Cheryl Dawson is a 54 y.o. female  Chief Complaint  Patient presents with  . removal of spot on skin    Pt states she has spot on left shoulder, here for removal today    SKIN LESION Duration: months Location: L scapula Painful: no Itching: no Onset: gradual Context: bigger History of skin cancer: no History of precancerous skin lesions: no Family history of skin cancer: no   Relevant past medical, surgical, family and social history reviewed and updated as indicated. Interim medical history since our last visit reviewed. Allergies and medications reviewed and updated.  Review of Systems  Constitutional: Negative.   Respiratory: Negative.   Cardiovascular: Negative.   Gastrointestinal: Negative.   Musculoskeletal: Negative.   Skin: Positive for color change. Negative for pallor, rash and wound.  Psychiatric/Behavioral: Negative.     Per HPI unless specifically indicated above     Objective:    BP 129/84   Pulse 60   Temp 98.4 F (36.9 C)   Wt 137 lb 12.8 oz (62.5 kg)   SpO2 99%   BMI 24.14 kg/m   Wt Readings from Last 3 Encounters:  05/14/20 137 lb 12.8 oz (62.5 kg)  04/15/20 138 lb 6.4 oz (62.8 kg)  03/10/20 144 lb (65.3 kg)    Physical Exam Vitals and nursing note reviewed.  Constitutional:      General: She is not in acute distress.    Appearance: Normal appearance. She is not ill-appearing, toxic-appearing or diaphoretic.  HENT:     Head: Normocephalic and atraumatic.     Right Ear: External ear normal.     Left Ear: External ear normal.     Nose: Nose normal.     Mouth/Throat:     Mouth: Mucous membranes are moist.     Pharynx: Oropharynx is clear.  Eyes:     General: No scleral icterus.       Right eye: No discharge.         Left eye: No discharge.     Extraocular Movements: Extraocular movements intact.     Conjunctiva/sclera: Conjunctivae normal.     Pupils: Pupils are equal, round, and reactive to light.  Cardiovascular:     Rate and Rhythm: Normal rate and regular rhythm.     Pulses: Normal pulses.     Heart sounds: Normal heart sounds. No murmur heard. No friction rub. No gallop.   Pulmonary:     Effort: Pulmonary effort is normal. No respiratory distress.     Breath sounds: Normal breath sounds. No stridor. No wheezing, rhonchi or rales.  Chest:     Chest wall: No tenderness.  Musculoskeletal:        General: Normal range of motion.     Cervical back: Normal range of motion and neck supple.  Skin:    General: Skin is warm and dry.     Capillary Refill: Capillary refill takes less than 2 seconds.     Coloration: Skin is not jaundiced or pale.     Findings: No bruising, erythema, lesion or rash.     Comments: Very dark irregular mole on L scapula   Neurological:  General: No focal deficit present.     Mental Status: She is alert and oriented to person, place, and time. Mental status is at baseline.  Psychiatric:        Mood and Affect: Mood normal.        Behavior: Behavior normal.        Thought Content: Thought content normal.        Judgment: Judgment normal.     Results for orders placed or performed in visit on 04/15/20  CBC with Differential/Platelet  Result Value Ref Range   WBC 4.0 3.4 - 10.8 x10E3/uL   RBC 5.10 3.77 - 5.28 x10E6/uL   Hemoglobin 15.1 11.1 - 15.9 g/dL   Hematocrit 44.0 34.0 - 46.6 %   MCV 86 79 - 97 fL   MCH 29.6 26.6 - 33.0 pg   MCHC 34.3 31.5 - 35.7 g/dL   RDW 11.8 11.7 - 15.4 %   Platelets 251 150 - 450 x10E3/uL   Neutrophils 53 Not Estab. %   Lymphs 38 Not Estab. %   Monocytes 7 Not Estab. %   Eos 1 Not Estab. %   Basos 1 Not Estab. %   Neutrophils Absolute 2.1 1.4 - 7.0 x10E3/uL   Lymphocytes Absolute 1.5 0.7 - 3.1 x10E3/uL   Monocytes Absolute 0.3  0.1 - 0.9 x10E3/uL   EOS (ABSOLUTE) 0.0 0.0 - 0.4 x10E3/uL   Basophils Absolute 0.0 0.0 - 0.2 x10E3/uL   Immature Granulocytes 0 Not Estab. %   Immature Grans (Abs) 0.0 0.0 - 0.1 x10E3/uL  Comprehensive metabolic panel  Result Value Ref Range   Glucose 80 65 - 99 mg/dL   BUN 16 6 - 24 mg/dL   Creatinine, Ser 0.90 0.57 - 1.00 mg/dL   GFR calc non Af Amer 74 >59 mL/min/1.73   GFR calc Af Amer 85 >59 mL/min/1.73   BUN/Creatinine Ratio 18 9 - 23   Sodium 141 134 - 144 mmol/L   Potassium 4.3 3.5 - 5.2 mmol/L   Chloride 104 96 - 106 mmol/L   CO2 24 20 - 29 mmol/L   Calcium 9.4 8.7 - 10.2 mg/dL   Total Protein 6.7 6.0 - 8.5 g/dL   Albumin 4.7 3.8 - 4.9 g/dL   Globulin, Total 2.0 1.5 - 4.5 g/dL   Albumin/Globulin Ratio 2.4 (H) 1.2 - 2.2   Bilirubin Total 0.5 0.0 - 1.2 mg/dL   Alkaline Phosphatase 67 44 - 121 IU/L   AST 8 0 - 40 IU/L   ALT 11 0 - 32 IU/L  Lipid Panel w/o Chol/HDL Ratio  Result Value Ref Range   Cholesterol, Total 139 100 - 199 mg/dL   Triglycerides 46 0 - 149 mg/dL   HDL 60 >39 mg/dL   VLDL Cholesterol Cal 11 5 - 40 mg/dL   LDL Chol Calc (NIH) 68 0 - 99 mg/dL  TSH  Result Value Ref Range   TSH 1.620 0.450 - 4.500 uIU/mL  Urinalysis, Routine w reflex microscopic  Result Value Ref Range   Specific Gravity, UA 1.020 1.005 - 1.030   pH, UA 7.0 5.0 - 7.5   Color, UA Yellow Yellow   Appearance Ur Clear Clear   Leukocytes,UA Negative Negative   Protein,UA Negative Negative/Trace   Glucose, UA Negative Negative   Ketones, UA Negative Negative   RBC, UA Negative Negative   Bilirubin, UA Negative Negative   Urobilinogen, Ur 0.2 0.2 - 1.0 mg/dL   Nitrite, UA Negative Negative  Hepatitis C Antibody  Result Value Ref Range   Hep C Virus Ab <0.1 0.0 - 0.9 s/co ratio      Assessment & Plan:   Problem List Items Addressed This Visit   None   Visit Diagnoses    Neoplasm of uncertain behavior of skin    -  Primary   Removed today as below and sent for pathology    Relevant Orders   Surgical pathology      Skin Procedure  Procedure: Informed consent given.  Sterile prep of the area.  Area infiltrated with lidocaine with epinephrine.  Using a surgical blade, part of the upper dermis shaved off and sent  for pathology.  Area cauterized. Pt ed on scarring     Diagnosis:   ICD-10-CM   1. Neoplasm of uncertain behavior of skin  D48.5 Surgical pathology   Removed today as below and sent for pathology    Lesion Location/Size: 3cm L scapula Physician: MJ Consent:  Risks, benefits, and alternative treatments discussed and all questions were answered.  Patient elected to proceed and verbal consent obtained.  Description: Area prepped and draped using semi-sterile technique. Area locally anesthetized using 3 cc's of lidocaine 2% with epi. Shave biopsy of lesion performed using a dermablade.  Adequate hemostastis achieved using Silver Nitrate. Wound dressed after application of bacitracin ointment.  Post Procedure Instructions: Wound care instructions discussed and patient was instructed to keep area clean and dry.  Signs and symptoms of infection discussed, patient agrees to contact the office ASAP should they occur.  Dressing change recommended every other day.   Follow up plan: Return if symptoms worsen or fail to improve.

## 2020-05-20 ENCOUNTER — Telehealth: Payer: Self-pay

## 2020-05-20 DIAGNOSIS — C439 Malignant melanoma of skin, unspecified: Secondary | ICD-10-CM

## 2020-05-20 LAB — SURGICAL PATHOLOGY

## 2020-05-20 NOTE — Telephone Encounter (Signed)
Received call from pathology notifying us that the place we removed is a level 2 melanoma that is 0.4 mm. They are sending report over in about 20 minutes but wanted to go ahead and make provider aware. States that this will need another excision.

## 2020-05-20 NOTE — Telephone Encounter (Signed)
Patient notified

## 2020-05-20 NOTE — Telephone Encounter (Signed)
Pt returned call about results/ please advise

## 2020-05-20 NOTE — Telephone Encounter (Signed)
Left message on machine for her to call back for results. Urgent referral placed to dermatology. Will need additional excision.

## 2020-05-20 NOTE — Telephone Encounter (Signed)
[facility] on 05/27/20 @ 11:45

## 2020-05-27 ENCOUNTER — Encounter: Payer: Self-pay | Admitting: Dermatology

## 2020-05-27 ENCOUNTER — Ambulatory Visit: Payer: BC Managed Care – PPO | Admitting: Dermatology

## 2020-05-27 ENCOUNTER — Other Ambulatory Visit: Payer: Self-pay

## 2020-05-27 DIAGNOSIS — C4359 Malignant melanoma of other part of trunk: Secondary | ICD-10-CM

## 2020-05-27 DIAGNOSIS — C439 Malignant melanoma of skin, unspecified: Secondary | ICD-10-CM

## 2020-05-27 NOTE — Progress Notes (Signed)
   New Patient Visit  Subjective  Cheryl Dawson is a 55 y.o. female who presents for the following: Melanoma (Biopsy proven by Olevia Perches, DO - left scapula).  Referral from [facility], DO  The following portions of the chart were reviewed this encounter and updated as appropriate:   Tobacco  Allergies  Meds  Problems  Med Hx  Surg Hx  Fam Hx     Review of Systems:  No other skin or systemic complaints except as noted in HPI or Assessment and Plan.  Objective  Well appearing patient in no apparent distress; mood and affect are within normal limits.  A focused examination was performed including back, lymphnodes. Relevant physical exam findings are noted in the Assessment and Plan.  Objective  Left scapula: Healing biopsy site with 1.5 cm crust. No lymphadenopathy.   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 No lymphadenopathy.  Discussed diagnosis in detail including significance of melanoma diagnosis which can be potentially lethal.  Discussed treatment recommendations in detail advising that treatment recommendations are based on longitudinal studies and retrospective studies and are nationwide protocols.  Advised there is always potential for recurrence even after definitive treatment.  After definitive treatment, we recommend total-body skin exams every 3 months for a year; then every 4 months for a year; then every 6 months for 3 years.  At 5 years post treatment, if all looks good we would recommend at least yearly total-body skin exams for the rest of your life.  The patient was given time for questions and these were answered.  We recommend frequent self skin examinations; photoprotection with sunscreen, sun protective clothing, hats, sunglasses and sun avoidance.  If the patient notices any new or changing skin lesions the patient should return to the office immediately for evaluation.     Recommend [place] testing.  Discussed excision. Will plan Wide Local Excision (WLE) here in our office.  Return for Follow up as scheduled, Surgery, TBSE.  I, Joanie Coddington, CMA, am acting as scribe for Armida Sans, MD .  Documentation: I have reviewed the above documentation for accuracy and completeness, and I agree with the above.  Armida Sans, MD

## 2020-05-27 NOTE — Patient Instructions (Signed)
Cheryl Dawson [address] [place] [address]   You have an upcoming surgery appointment scheduled at [facility] @FUTURESURGAPPT @  PRE-OPERATIVE INSTRUCTIONS  We recommend you read the following instructions. If you have any questions or concerns, please call the office at [phone].  1. Shower and wash the entire body with soap and water the day of your surgery paying special attention to cleansing at and around the planned surgery site. 2. Avoid aspirin or aspirin containing products at least ten (10) days prior to your surgical procedure and for at least one week (7 days) after your surgical procedure. If you take aspirin on a regular basis for heart disease or history of stroke or for any other reason, we may recommend you continue taking aspirin but please notify [phone] if you take this on a regular basis. Aspirin can cause more bleeding to occur during surgery as well as prolonged bleeding and bruising after surgery. 3. Avoid other nonsteroidal pain medications at least one week prior to surgery and at least one week after your surgery. These include medications such as Ibuprofen (Motrin, Advil and Nuprin), Naprosyn, Voltaren, Relafen, etc. If these medications are used for therapeutic reasons, please inform us as they can cause increased bleeding or prolonged bleeding during and bruising after surgical procedures.  4. Please advise [place] if you are taking any "blood thinner" medications such as Coumadin or Dipyridamole or Plavix or similar medications. These cause increased bleeding and prolonged bleeding during procedures and bruising after surgical procedures. We may have to consider discontinuing these medications briefly prior to and shortly after your surgery if safe to do so. 5. Please inform us of all medications you are currently taking. All medications that are taken regularly should be taken the day of surgery as you always do. Nevertheless, we need to be informed of what  medications you are taking prior to surgery to know whether they will affect the procedure or cause any complications. 6. Please inform us of any medication allergies. Also inform us of whether you have allergies to Latex or rubber products or whether you have had any adverse reaction to Lidocaine or Epinephrine. 7. Please inform us of any prosthetic or artificial body parts such as artificial heart valve, joint replacements, etc., or similar condition that might require preoperative antibiotics. 8. We recommend avoidance of alcohol at least two weeks prior to surgery and continued avoidance for at least two weeks after surgery. 9. We recommend avoidance of tobacco smoking at least two weeks prior to surgery and continued abstinence for at least two weeks after surgery. 10. Do not plan strenuous exercise, strenuous work or strenuous lifting for approximately four weeks after your surgery. 11. We request if you are unable to make your scheduled surgical appointment, please call us at least a week in advance or as soon as you are aware of a problem so that we can cancel or reschedule the appointment. 12. You MAY TAKE TYLENOL (acetaminophen) for pain as it is not a blood thinner. 13. PLEASE PLAN TO BE IN TOWN FOR TWO WEEKS FOLLOWING SURGERY. THIS IS IMPORTANT SO YOU CAN BE CHECKED FOR DRESSING CHANGES, SUTURE REMOVAL AND TO MONITOR FOR POSSIBLE COMPLICATIONS.    Discussed diagnosis in detail including significance of melanoma diagnosis which can be potentially lethal.  Discussed treatment recommendations in detail advising that treatment recommendations are based on longitudinal studies and retrospective studies and are nationwide protocols.  Advised there is always potential for recurrence even after definitive treatment.  After definitive treatment, we recommend total-body skin exams every 3 months for a year; then every 4 months for a year; then every 6 months for 3 years.  At 5 years post treatment,  if all looks good we would recommend at least yearly total-body skin exams for the rest of your life.  The patient was given time for questions and these were answered.  We recommend frequent self skin examinations; photoprotection with sunscreen, sun protective clothing, hats, sunglasses and sun avoidance.  If the patient notices any new or changing skin lesions the patient should return to the office immediately for evaluation.

## 2020-06-02 ENCOUNTER — Other Ambulatory Visit: Payer: BC Managed Care – PPO

## 2020-06-02 DIAGNOSIS — Z20822 Contact with and (suspected) exposure to covid-19: Secondary | ICD-10-CM

## 2020-06-03 ENCOUNTER — Telehealth (INDEPENDENT_AMBULATORY_CARE_PROVIDER_SITE_OTHER): Payer: BC Managed Care – PPO | Admitting: Family Medicine

## 2020-06-03 ENCOUNTER — Encounter: Payer: Self-pay | Admitting: Family Medicine

## 2020-06-03 VITALS — Temp 96.8°F

## 2020-06-03 DIAGNOSIS — Z20822 Contact with and (suspected) exposure to covid-19: Secondary | ICD-10-CM

## 2020-06-03 MED ORDER — HYDROCOD POLST-CPM POLST ER 10-8 MG/5ML PO SUER: 5.0000 mL | Freq: Two times a day (BID) | ORAL | 0 refills | Status: DC | PRN

## 2020-06-03 MED ORDER — PREDNISONE 50 MG PO TABS: 50.0000 mg | ORAL_TABLET | Freq: Every day | ORAL | 0 refills | Status: DC

## 2020-06-03 NOTE — Progress Notes (Signed)
Temp (!) 96.8 F (36 C)    Subjective:    Patient ID: Cheryl Dawson, female    DOB: [birth year], 55 y.o.   MRN: [id number]  HPI: Cheryl Dawson is a 55 y.o. female  Chief Complaint  Patient presents with  . URI    Pt states she has had a cough and stuffy nose. States she had a covid test last night, still waiting on results. States her husband did test positive yesterday as well.    UPPER RESPIRATORY TRACT INFECTION Duration: 2 days ago Worst symptom: cough and congestion Fever: no Cough: yes Shortness of breath: no Wheezing: no Chest pain: no Chest tightness: no Chest congestion: yes Nasal congestion: yes Runny nose: no Post nasal drip: yes Sneezing: yes Sore throat: no Swollen glands: no Sinus pressure: no Headache: no Face pain: no Toothache: no Ear pain: no  Ear pressure: yes  Eyes red/itching:no Eye drainage/crusting: no  Vomiting: no Rash: no Fatigue: no Sick contacts: yes Strep contacts: no  Context: stable Recurrent sinusitis: no Relief with OTC cold/cough medications: no  Treatments attempted: advil    Relevant past medical, surgical, family and social history reviewed and updated as indicated. Interim medical history since our last visit reviewed. Allergies and medications reviewed and updated.  Review of Systems  Constitutional: Negative.   HENT: Positive for congestion and postnasal drip. Negative for dental problem, drooling, ear discharge, ear pain, facial swelling, hearing loss, mouth sores, nosebleeds, rhinorrhea, sinus pressure, sinus pain, sneezing, sore throat, tinnitus, trouble swallowing and voice change.   Eyes: Negative.   Respiratory: Positive for cough. Negative for apnea, choking, chest tightness, shortness of breath, wheezing and stridor.   Cardiovascular: Negative.   Gastrointestinal: Negative.   Musculoskeletal: Negative.   Psychiatric/Behavioral: Negative.     Per HPI unless specifically indicated above     Objective:     Temp (!) 96.8 F (36 C)   Wt Readings from Last 3 Encounters:  05/14/20 137 lb 12.8 oz (62.5 kg)  04/15/20 138 lb 6.4 oz (62.8 kg)  03/10/20 144 lb (65.3 kg)    Physical Exam Vitals and nursing note reviewed.  Pulmonary:     Effort: Pulmonary effort is normal. No respiratory distress.     Comments: Speaking in full sentences Neurological:     Mental Status: She is alert.  Psychiatric:        Mood and Affect: Mood normal.        Behavior: Behavior normal.        Thought Content: Thought content normal.        Judgment: Judgment normal.     Results for orders placed or performed in visit on 05/14/20  Surgical pathology  Result Value Ref Range   SURGICAL PATHOLOGY      SURGICAL PATHOLOGY CASE: [id number] PATIENT: [facility] Report     Clinical History: neoplasm of uncertain behavior of skin (cm)     FINAL MICROSCOPIC DIAGNOSIS:  A.   SKIN, LEFT SCAPULA, EXCISION: MALIGNANT MELANOMA, CLOSE TO LATERAL MARGIN (SEE TABLE) MELANOMA TABLE (AJCC 8TH EDITION) PROCEDURE: EXCISION SPECIMEN ANATOMIC SITE: LEFT SCAPULA HISTOLOGIC TYPE: SUPERFICIAL SPREADING, ARISING IN A NEVUS BRESLOW'S DEPTH: 0.4 MM CLARK/ANATOMIC LEVEL: III MARGINS: PERIPHERAL < 1 MM FROM MELANOMA           DEEP: FREE ULCERATION: ABSENT SATELLITOSIS: ABSENT MITOTIC INDEX: <1/MM2 (0) LYMPHO-VASCULAR INVASION: ABSENT NEUROTROPISM: ABSENT TUMOR-INFILTRATING LYMPHOCYTES: NON-BRISK TUMOR REGRESSION: ABSENT LYMPH NODES: N/A PATHOLOGIC STAGE : PT1A NX MX COMMENT:  Sections show amphophilic severely atypical melanocytes with focal upward growth and invasion of similarly atypical melanocytes into the dermis, consonant with malignant melanoma. A  melanA highlights the melanocytes. There are benign dermal melanocytes subjacent to melanoma, representing a precursor lesion. A complete re-excision is recommended. I spoke with Dr. Durenda Age nurse Tiffany about this case on  05/20/20.    GROSS DESCRIPTION:  Specimen: Tan-white skin shave without fat, left scapula, received in formalin. Size: 1.1 x 1 by less than 0.1 cm Lesion: There is a central 0.8 x 0.6 cm dark brown well-defined macule. Orientation: None, all margins a black. Block Summary: Trisected and submitted 1 block.  SW 05/18/2020   Final Diagnosis performed by Duard Brady, MD.   Electronically signed 05/20/2020 Technical component performed at [facility]. [facility], [place] [address], [place], [place] [address].  Professional component performed at [facility] [address] [place], [place], [place] [address].  Immunohistochemistry Technical component (if applicable) was performed at [facility]. [address], [place], [place], [place] [address].   IMMUNOHISTOCHEMISTRY DISCLAIMER (if applicable): Some of these immunohistochemical stains may have been developed and the performance characteristics determine by [facility]. Some may not have been cleared or approved by the U.S. Food and Drug Administration. The FDA has determined that such clearance or approval is not necessary. This test is used for clinical purposes. It should not be regarded as investigational or for research. This laboratory is certified under the [place] (CLIA-88) as qualified to perform high complexity clinical laboratory testing.  The controls stained appropriately.       Assessment & Plan:   Problem List Items Addressed This Visit   None   Visit Diagnoses    Suspected COVID-19 virus infection    -  Primary   Awaiting results. Self-quarantine until results are back. Treat symptomatically with prednisone and tussionex. Call if not getting better or getting worse.        Follow up plan: Return if symptoms worsen or fail to improve.    . This visit was completed via telephone due to the  restrictions of the COVID-19 pandemic. All issues as above were discussed and addressed but no physical exam was performed. If it was felt that the patient should be evaluated in the office, they were directed there. The patient verbally consented to this visit. Patient was unable to complete an audio/visual visit due to Technical difficulties. Due to the catastrophic nature of the COVID-19 pandemic, this visit was done through audio contact only. . Location of the patient: home . Location of the provider: home . Those involved with this call:  . Provider: Park Liter, DO . CMA: Yvonna Alanis, [place] . Front Desk/Registration: Jill Side  . Time spent on call: 21 minutes on the phone discussing health concerns. 30 minutes total spent in review of patient's record and preparation of their chart.

## 2020-06-04 LAB — NOVEL CORONAVIRUS, NAA: SARS-CoV-2, NAA: DETECTED — AB

## 2020-06-04 LAB — SARS-COV-2, NAA 2 DAY TAT

## 2020-06-05 ENCOUNTER — Telehealth: Payer: Self-pay | Admitting: Family Medicine

## 2020-06-05 DIAGNOSIS — C439 Malignant melanoma of skin, unspecified: Secondary | ICD-10-CM

## 2020-06-05 DIAGNOSIS — U071 COVID-19: Secondary | ICD-10-CM

## 2020-06-05 NOTE — Telephone Encounter (Signed)
Please advised pt seen 1/12

## 2020-06-05 NOTE — Telephone Encounter (Signed)
Patient is calling to report she tested positive for COVID. Patient recalls Dr. Wynetta Emery mention she could get some samples. But not sure. Please advise [phone]

## 2020-06-05 NOTE — Telephone Encounter (Signed)
No samples. But I will refer her to [facility] due to her melanoma- they will call within 24-48 hours if she qualifies. If she doesn't, she will not hear from them

## 2020-06-05 NOTE — Telephone Encounter (Signed)
Patient notified

## 2020-06-09 ENCOUNTER — Other Ambulatory Visit: Payer: Self-pay

## 2020-06-09 ENCOUNTER — Telehealth: Payer: Self-pay

## 2020-06-09 ENCOUNTER — Ambulatory Visit: Payer: BC Managed Care – PPO | Admitting: Dermatology

## 2020-06-09 DIAGNOSIS — C4359 Malignant melanoma of other part of trunk: Secondary | ICD-10-CM | POA: Diagnosis not present

## 2020-06-09 DIAGNOSIS — C439 Malignant melanoma of skin, unspecified: Secondary | ICD-10-CM

## 2020-06-09 HISTORY — DX: Malignant melanoma of skin, unspecified: C43.9

## 2020-06-09 MED ORDER — MUPIROCIN 2 % EX OINT: 1.0000 "application " | TOPICAL_OINTMENT | Freq: Every day | CUTANEOUS | 0 refills | Status: DC

## 2020-06-09 NOTE — Progress Notes (Unsigned)
   Follow-Up Visit   Subjective  Cheryl Dawson is a 55 y.o. female who presents for the following: Procedure (Biopsy proven Melanoma of left scapula - Biopsied by Dr. Park Liter - [facility] today).  The following portions of the chart were reviewed this encounter and updated as appropriate:   Tobacco  Allergies  Meds  Problems  Med Hx  Surg Hx  Fam Hx     Review of Systems:  No other skin or systemic complaints except as noted in HPI or Assessment and Plan.  Objective  Well appearing patient in no apparent distress; mood and affect are within normal limits.  A focused examination was performed including back. Relevant physical exam findings are noted in the Assessment and Plan.  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  Skin excision  Lesion length (cm):  1.1 Lesion width (cm):  1.1 Margin per side (cm):  1 Total excision diameter (cm):  3.1 Informed consent: discussed and consent obtained   Timeout: patient name, date of birth, surgical site, and procedure verified   Procedure prep:  Patient was prepped and draped in usual sterile fashion Prep type:  Isopropyl alcohol and povidone-iodine Anesthesia: the lesion was anesthetized in a standard fashion   Anesthetic:  1% lidocaine w/ epinephrine 1-100,000 buffered w/ 8.4% NaHCO3 Instrument used: #15 blade   Hemostasis achieved with: pressure   Hemostasis achieved with comment:  Electrocautery Outcome: patient tolerated procedure well with no complications   Post-procedure details: sterile dressing applied and wound care instructions given   Dressing type: bandage and pressure dressing (mupirocin)   Additional details:  Suture marks the 2:00 edge  Skin repair Complexity:  Complex Final length (cm):  8 Reason for type of repair: reduce tension to allow closure, reduce the risk of dehiscence, infection, and necrosis, reduce subcutaneous dead space and  avoid a hematoma, allow closure of the large defect, preserve normal anatomy, preserve normal anatomical and functional relationships and enhance both functionality and cosmetic results   Undermining: area extensively undermined   Undermining comment:  Undermining defect 3.1 cm Subcutaneous layers (deep stitches):  Suture size:  2-0 Suture type: Vicryl (polyglactin 910)   Subcutaneous suture technique: inverted dermal. Fine/surface layer approximation (top stitches):  Suture size:  2-0 Suture type: nylon   Stitches: simple running   Suture removal (days):  7 Hemostasis achieved with: suture and pressure Outcome: patient tolerated procedure well with no complications   Post-procedure details: sterile dressing applied and wound care instructions given   Dressing type: bandage and pressure dressing (mupirocin)    mupirocin ointment (BACTROBAN) 2 %  Specimen 1 - Surgical pathology Differential Diagnosis: Biopsy proven Melanoma Check Margins: Yes Healing biopsy site Case # [id number] Suture marks the 2:00 edge  Start Targadox 50mg  2 po bid with food and drink, samples x 4 given Lot [id number] exp 07/22  Return in about 1 week (around 06/16/2020) for suture removal.   I, Ashok Cordia, CMA, am acting as scribe for Sarina Ser, MD .  Documentation: I have reviewed the above documentation for accuracy and completeness, and I agree with the above.  Sarina Ser, MD

## 2020-06-09 NOTE — Telephone Encounter (Signed)
Spoke with patient regarding surgery. She is doing fine/hd 

## 2020-06-09 NOTE — Patient Instructions (Signed)

## 2020-06-10 ENCOUNTER — Encounter: Payer: Self-pay | Admitting: Dermatology

## 2020-06-16 ENCOUNTER — Other Ambulatory Visit: Payer: Self-pay

## 2020-06-16 ENCOUNTER — Ambulatory Visit (INDEPENDENT_AMBULATORY_CARE_PROVIDER_SITE_OTHER): Payer: BC Managed Care – PPO | Admitting: Dermatology

## 2020-06-16 ENCOUNTER — Encounter: Payer: Self-pay | Admitting: Dermatology

## 2020-06-16 DIAGNOSIS — D1800 Hemangioma unspecified site: Secondary | ICD-10-CM

## 2020-06-16 DIAGNOSIS — Z85820 Personal history of malignant melanoma of skin: Secondary | ICD-10-CM | POA: Diagnosis not present

## 2020-06-16 DIAGNOSIS — L578 Other skin changes due to chronic exposure to nonionizing radiation: Secondary | ICD-10-CM

## 2020-06-16 DIAGNOSIS — L814 Other melanin hyperpigmentation: Secondary | ICD-10-CM

## 2020-06-16 DIAGNOSIS — D229 Melanocytic nevi, unspecified: Secondary | ICD-10-CM

## 2020-06-16 DIAGNOSIS — Z1283 Encounter for screening for malignant neoplasm of skin: Secondary | ICD-10-CM

## 2020-06-16 DIAGNOSIS — B009 Herpesviral infection, unspecified: Secondary | ICD-10-CM | POA: Diagnosis not present

## 2020-06-16 DIAGNOSIS — L821 Other seborrheic keratosis: Secondary | ICD-10-CM

## 2020-06-16 MED ORDER — VALACYCLOVIR HCL 500 MG PO TABS: ORAL_TABLET | ORAL | 11 refills | Status: DC

## 2020-06-16 NOTE — Progress Notes (Signed)
Follow-Up Visit   Subjective  Cheryl Dawson is a 55 y.o. female who presents for the following: Annual Exam (Hx MM - L scapula ) and History of melanoma (L scapula - pathology proven margins free, patient is here today for suture removal). The patient presents for Total-Body Skin Exam (TBSE) for skin cancer screening and mole check.  The following portions of the chart were reviewed this encounter and updated as appropriate:   Tobacco  Allergies  Meds  Problems  Med Hx  Surg Hx  Fam Hx     Review of Systems:  No other skin or systemic complaints except as noted in HPI or Assessment and Plan.  Objective  Well appearing patient in no apparent distress; mood and affect are within normal limits.  A focused examination was performed including extremities, including the arms, hands, fingers, and fingernails and the legs, feet, toes, and toenails. Relevant physical exam findings are noted in the Assessment and Plan.  Objective  L scapula: Healing excision site  Objective  R sacal area: Crust   Assessment & Plan  History of melanoma L scapula  Encounter for Removal of Sutures - Incision site at the L scapula is clean, dry and intact - Wound cleansed, sutures removed, wound cleansed and steri strips applied.  - Discussed pathology results showing margins free melanoma - Patient advised to keep steri-strips dry until they fall off. - Scars remodel for a full year. - Once steri-strips fall off, patient can apply over-the-counter silicone scar cream each night to help with scar remodeling if desired. - Patient advised to call with any concerns or if they notice any new or changing lesions.  Return to work 06/22/20. Ok to lift up to 15 lbs for the next week following returning to work. No restrictions after one week.  Discussed [place] test results.  Herpes simplex R sacal area  Herpes Simplex Virus = Cold Sores = Fever Blisters is a chronic recurring blistering; scabbing  sore-producing viral infection that is recurrent usually in the same area triggered by stress, sun/UV exposure and trauma.  It is infectious and can be spread from person to person by direct contact.  It is not curable, but is treatable with topical and oral medication.   Start Valtrex 500mg  po BID x 1 week as soon as symptoms recur. #30 11Rf.   valACYclovir (VALTREX) 500 MG tablet - R sacal area  Skin cancer screening  Lentigines - Scattered tan macules - Discussed due to sun exposure - Benign, observe - Call for any changes  Seborrheic Keratoses - Stuck-on, waxy, tan-brown papules and plaques  - Discussed benign etiology and prognosis. - Observe - Call for any changes  Melanocytic Nevi - Tan-brown and/or pink-flesh-colored symmetric macules and papules - Benign appearing on exam today - Observation - Call clinic for new or changing moles - Recommend daily use of broad spectrum spf 30+ sunscreen to sun-exposed areas.   Hemangiomas - Red papules - Discussed benign nature - Observe - Call for any changes  Actinic Damage - Chronic, secondary to cumulative UV/sun exposure - diffuse scaly erythematous macules with underlying dyspigmentation - Recommend daily broad spectrum sunscreen SPF 30+ to sun-exposed areas, reapply every 2 hours as needed.  - Call for new or changing lesions.  Skin cancer screening performed today.  Return in about 3 months (around 09/14/2020) for TBSE - Hx MM .  Luther Redo, CMA, am acting as scribe for Sarina Ser, MD .  Documentation: I have  reviewed the above documentation for accuracy and completeness, and I agree with the above.  Sarina Ser, MD

## 2020-06-19 ENCOUNTER — Encounter: Payer: Self-pay | Admitting: Dermatology

## 2020-07-13 ENCOUNTER — Telehealth: Payer: Self-pay

## 2020-07-13 NOTE — Telephone Encounter (Signed)
Copied from [place] [id number]. Topic: General - Inquiry >> Jul 13, 2020  4:09 PM Gillis Ends D wrote: Reason for CRM: Patient received a bill from [facility] and they told the patient that they received a bill because of the code that was used for the patient. She would like a call back to discuss the billing issue. She can be reached at [phone]. Please Advise

## 2020-09-17 ENCOUNTER — Ambulatory Visit: Payer: BC Managed Care – PPO | Admitting: Dermatology

## 2021-03-16 ENCOUNTER — Other Ambulatory Visit: Payer: Self-pay | Admitting: Family Medicine

## 2021-03-16 NOTE — Telephone Encounter (Signed)
Pt under dermatologist care for melanoma.  Called pt and LM to call back to find out.: if me is part of current therapy or not.   Last filled : 04/15/20 60 grams 1 RF. Med is active and due . No future visit scheduled.   Requested Prescriptions  Pending Prescriptions Disp Refills   clobetasol ointment (TEMOVATE) 0.05 % [Pharmacy Med Name: CLOBETASOL 0.05% OINTMENT] 60 g 1    Sig: APPLY TO AFFECTED AREA TWICE A DAY     Dermatology:  Corticosteroids Passed - 03/16/2021  9:21 AM      Passed - Valid encounter within last 12 months    Recent Outpatient Visits           9 months ago Suspected COVID-19 virus infection   [facility] [place], Megan P, DO   10 months ago Neoplasm of uncertain behavior of skin   [facility] [place], Megan P, DO   11 months ago Routine general medical examination at a health care facility   [place], [place], DO   1 year ago Subconjunctival hemorrhage of right eye   [facility] Valerie Roys, DO   1 year ago Annual physical exam   [facility] [place], [place]

## 2021-03-16 NOTE — Telephone Encounter (Signed)
Your patient 

## 2021-03-16 NOTE — Telephone Encounter (Signed)
Fyi.

## 2021-05-18 ENCOUNTER — Ambulatory Visit: Payer: Self-pay | Admitting: Internal Medicine

## 2021-05-18 ENCOUNTER — Ambulatory Visit: Payer: Self-pay

## 2021-05-18 NOTE — Telephone Encounter (Signed)
Opened this by mistake.   See previous encounter.

## 2021-05-18 NOTE — Telephone Encounter (Signed)
Chief Complaint: Nasal congestion, cough Symptoms: Greenish mucus when blowing nose and coughing up mucus, chest tightness with coughing Frequency: 3 days ago Pertinent Negatives: Patient denies CP, SOB Disposition: [] ED /[] Urgent Care (no appt availability in office) / [x] Appointment(In office/virtual)/ []  [facility]/ [] Home Care/ [] Refused Recommended Disposition  Additional Notes: Patient called back to offer [facility] care options, left message to call back to speak to a nurse.    Summary: poss sinus inf   Pt called having green congestion, coughing , chest congestion, no fever.  made an appt for Thursday but that was the first one.   CB# [phone]      Reason for Disposition  [1] Continuous (nonstop) coughing interferes with work or school AND [2] no improvement using cough treatment per Care Advice  Answer Assessment - Initial Assessment Questions 1. ONSET: "When did the cough begin?"      3 days 2. SEVERITY: "How bad is the cough today?"      Sucking on cough drops all day long 3. SPUTUM: "Describe the color of your sputum" (none, dry cough; clear, white, yellow, green)     Greenish 4. HEMOPTYSIS: "Are you coughing up any blood?" If so ask: "How much?" (flecks, streaks, tablespoons, etc.)     No 5. DIFFICULTY BREATHING: "Are you having difficulty breathing?" If Yes, ask: "How bad is it?" (e.g., mild, moderate, severe)    - MILD: No SOB at rest, mild SOB with walking, speaks normally in sentences, can lie down, no retractions, pulse < 100.    - MODERATE: SOB at rest, SOB with minimal exertion and prefers to sit, cannot lie down flat, speaks in phrases, mild retractions, audible wheezing, pulse 100-120.    - SEVERE: Very SOB at rest, speaks in single words, struggling to breathe, sitting hunched forward, retractions, pulse > 120      No 6. FEVER: "Do you have a fever?" If Yes, ask: "What is your temperature, how was it measured, and when did it start?"      No 7. CARDIAC HISTORY: "Do you have any history of heart disease?" (e.g., heart attack, congestive heart failure)      N/A 8. LUNG HISTORY: "Do you have any history of lung disease?"  (e.g., pulmonary embolus, asthma, emphysema)     N/A 9. PE RISK FACTORS: "Do you have a history of blood clots?" (or: recent major surgery, recent prolonged travel, bedridden)     N/A 10. OTHER SYMPTOMS: "Do you have any other symptoms?" (e.g., runny nose, wheezing, chest pain)       Chest tightness when coughing, nasal congestion with green mucus 11. PREGNANCY: "Is there any chance you are pregnant?" "When was your last menstrual period?"       N/A 12. TRAVEL: "Have you traveled out of the country in the last month?" (e.g., travel history, exposures)       N/a  Protocols used: Cough - Acute Productive-A-AH

## 2021-05-18 NOTE — Telephone Encounter (Signed)
Pt called in because someone called her and left a message to call in regarding an appt.   She has decided to keep the appt in the office on 05/20/2021 rather than do a virtual appt.

## 2021-05-19 NOTE — Progress Notes (Signed)
Acute Office Visit  Subjective:    Patient ID: Cheryl Dawson, female    DOB: [birth year], 55 y.o.   MRN: [id number]  Chief Complaint  Patient presents with   Cough    All started on Monday, has been taking OTC Advil cold and sinus which has not been helping   Headache   sinus pressure    HPI Patient is in today for sinus pain, cough, and headache that started on 3 days ago.   UPPER RESPIRATORY TRACT INFECTION  Worst symptom: nasal congestion  Fever: no Cough: yes Shortness of breath: no Wheezing: no Chest pain: yes, with cough Chest tightness: no Chest congestion: no Nasal congestion: yes Runny nose: yes Post nasal drip: yes Sneezing: yes Sore throat: no Swollen glands: no Sinus pressure: yes Headache: no Face pain: no Toothache: no Ear pain: no  Ear pressure: no  Eyes red/itching:no Eye drainage/crusting: no  Vomiting: no Rash: no Fatigue: yes Sick contacts: no Strep contacts: no  Context: stable Recurrent sinusitis: no Relief with OTC cold/cough medications: no  Treatments attempted: Advil cold and sinus    Past Medical History:  Diagnosis Date   Melanoma ([place]) 06/09/2020   L scapula - [facility]    Migraine    Undiagnosed cardiac murmurs    Uterine fibroids affecting pregnancy     Past Surgical History:  Procedure Laterality Date   ABDOMINAL HYSTERECTOMY     CESAREAN SECTION     x2   COLONOSCOPY WITH PROPOFOL N/A 04/26/2019   Procedure: COLONOSCOPY WITH PROPOFOL;  Surgeon: Virgel Manifold, MD;  Location: [facility];  Service: Endoscopy;  Laterality: N/A;   DOPPLER ECHOCARDIOGRAPHY  2006   tubes reversed      Family History  Problem Relation Age of Onset   Aneurysm Mother    Diabetes Father    Heart disease Sister    Heart disease Brother    Stroke Brother    Breast cancer Maternal Aunt     Social History   Socioeconomic History   Marital status: Married    Spouse name: Not on file   Number of children: Not on file    Years of education: Not on file   Highest education level: Not on file  Occupational History   Not on file  Tobacco Use   Smoking status: Never   Smokeless tobacco: Never  Vaping Use   Vaping Use: Never used  Substance and Sexual Activity   Alcohol use: Yes    Alcohol/week: 0.0 standard drinks    Comment: rare   Drug use: No   Sexual activity: Not on file  Other Topics Concern   Not on file  Social History Narrative   Not on file   Social Determinants of Health   Financial Resource Strain: Not on file  Food Insecurity: Not on file  Transportation Needs: Not on file  Physical Activity: Not on file  Stress: Not on file  Social Connections: Not on file  Intimate Partner Violence: Not on file    Outpatient Medications Prior to Visit  Medication Sig Dispense Refill   clobetasol ointment (TEMOVATE) 0.05 % APPLY TO AFFECTED AREA TWICE A DAY 60 g 1   SUMAtriptan (IMITREX) 100 MG tablet Take 1 tablet (100 mg total) by mouth every 2 (two) hours as needed for migraine (max 2 a day). May repeat in 2 hours if headache persists (Patient not taking: Reported on 05/20/2021) 10 tablet 11   tobramycin (TOBREX) 0.3 % ophthalmic solution  SMARTSIG:In Eye(s) (Patient not taking: Reported on 05/20/2021)     valACYclovir (VALTREX) 500 MG tablet Take one tab po BID x 7 days at onset of outbreak. (Patient not taking: Reported on 05/20/2021) 30 tablet 11   chlorpheniramine-HYDROcodone (TUSSIONEX PENNKINETIC ER) 10-8 MG/5ML SUER Take 5 mLs by mouth every 12 (twelve) hours as needed. (Patient not taking: Reported on 06/16/2020) 50 mL 0   mupirocin ointment (BACTROBAN) 2 % Apply 1 application topically daily. With dressing changes (Patient not taking: Reported on 06/16/2020) 22 g 0   predniSONE (DELTASONE) 50 MG tablet Take 1 tablet (50 mg total) by mouth daily with breakfast. 5 tablet 0   No facility-administered medications prior to visit.    Allergies  Allergen Reactions   Penicillins Hives   Sulfa  Antibiotics Rash    Review of Systems  Constitutional:  Positive for fatigue. Negative for fever.  HENT:  Positive for congestion, postnasal drip, rhinorrhea, sinus pressure and sneezing. Negative for ear pain and sore throat.   Eyes: Negative.   Respiratory:  Positive for cough. Negative for shortness of breath.   Cardiovascular: Negative.   Gastrointestinal: Negative.   Endocrine: Negative.   Genitourinary: Negative.   Musculoskeletal: Negative.   Skin: Negative.   Neurological: Negative.   Psychiatric/Behavioral: Negative.        Objective:    Physical Exam Vitals and nursing note reviewed.  Constitutional:      General: She is not in acute distress.    Appearance: Normal appearance.  HENT:     Head: Normocephalic.     Right Ear: Tympanic membrane, ear canal and external ear normal.     Left Ear: Tympanic membrane, ear canal and external ear normal.  Eyes:     Conjunctiva/sclera: Conjunctivae normal.  Cardiovascular:     Rate and Rhythm: Normal rate and regular rhythm.     Pulses: Normal pulses.     Heart sounds: Normal heart sounds.  Pulmonary:     Effort: Pulmonary effort is normal.     Breath sounds: Normal breath sounds.  Musculoskeletal:     Cervical back: Normal range of motion and neck supple. No tenderness.  Lymphadenopathy:     Cervical: No cervical adenopathy.  Skin:    General: Skin is warm.  Neurological:     General: No focal deficit present.     Mental Status: She is alert and oriented to person, place, and time.  Psychiatric:        Mood and Affect: Mood normal.        Behavior: Behavior normal.        Thought Content: Thought content normal.        Judgment: Judgment normal.    BP 137/84    Pulse (!) 57    Temp 98.2 F (36.8 C) (Oral)    Wt 140 lb (63.5 kg)    SpO2 99%    BMI 24.53 kg/m  Wt Readings from Last 3 Encounters:  05/20/21 140 lb (63.5 kg)  05/14/20 137 lb 12.8 oz (62.5 kg)  04/15/20 138 lb 6.4 oz (62.8 kg)    There are no  preventive care reminders to display for this patient.   There are no preventive care reminders to display for this patient.   Lab Results  Component Value Date   TSH 1.620 04/15/2020   Lab Results  Component Value Date   WBC 4.0 04/15/2020   HGB 15.1 04/15/2020   HCT 44.0 04/15/2020   MCV 86 04/15/2020  PLT 251 04/15/2020   Lab Results  Component Value Date   NA 141 04/15/2020   K 4.3 04/15/2020   CO2 24 04/15/2020   GLUCOSE 80 04/15/2020   BUN 16 04/15/2020   CREATININE 0.90 04/15/2020   BILITOT 0.5 04/15/2020   ALKPHOS 67 04/15/2020   AST 8 04/15/2020   ALT 11 04/15/2020   PROT 6.7 04/15/2020   ALBUMIN 4.7 04/15/2020   CALCIUM 9.4 04/15/2020   Lab Results  Component Value Date   CHOL 139 04/15/2020   Lab Results  Component Value Date   HDL 60 04/15/2020   Lab Results  Component Value Date   LDLCALC 68 04/15/2020   Lab Results  Component Value Date   TRIG 46 04/15/2020   Lab Results  Component Value Date   CHOLHDL 2.4 04/09/2018   No results found for: HGBA1C     Assessment & Plan:   Problem List Items Addressed This Visit   None Visit Diagnoses     Upper respiratory tract infection, unspecified type    -  Primary   Flu negative, covid-19 pending. Most likely viral. Increase fluids, rest. Start flonase daily, can use afrin for 3 days prn.Start zpak if not feeling better Mon   Relevant Medications   azithromycin (ZITHROMAX) 250 MG tablet   Other Relevant Orders   Novel Coronavirus, NAA ([facility])   Veritor Flu A/B Waived        Meds ordered this encounter  Medications   azithromycin (ZITHROMAX) 250 MG tablet    Sig: Take 2 tablets on day 1, then 1 tablet daily on days 2 through 5    Dispense:  6 tablet    Refill:  0     Charyl Dancer, NP

## 2021-05-20 ENCOUNTER — Encounter: Payer: Self-pay | Admitting: Nurse Practitioner

## 2021-05-20 ENCOUNTER — Ambulatory Visit: Payer: BC Managed Care – PPO | Admitting: Nurse Practitioner

## 2021-05-20 ENCOUNTER — Other Ambulatory Visit: Payer: Self-pay

## 2021-05-20 VITALS — BP 137/84 | HR 57 | Temp 98.2°F | Wt 140.0 lb

## 2021-05-20 DIAGNOSIS — J069 Acute upper respiratory infection, unspecified: Secondary | ICD-10-CM | POA: Diagnosis not present

## 2021-05-20 LAB — VERITOR FLU A/B WAIVED
Influenza A: NEGATIVE
Influenza B: NEGATIVE

## 2021-05-20 MED ORDER — AZITHROMYCIN 250 MG PO TABS: ORAL_TABLET | ORAL | 0 refills | Status: AC

## 2021-05-20 NOTE — Patient Instructions (Addendum)
Flonase nasal spray over the counter daily  Afrin nasal spray at bed time for 3 days (help with nasal congestion) Increase fluids (gatorade, juice) Start antibiotic on Monday if not feeling better  Your symptoms and exam findings are most consistent with a viral upper respiratory infection. These usually run their course in 5-7 days. Unfortunately, antibiotics don't work against viruses and just increase your risk of other issues such as diarrhea, yeast infections, and resistant infections.  If you start feeling worse with facial pain, high fever, cough, shortness of breath or start feeling significantly worse, please call us right away to be further evaluated.  Some things that can make you feel better are: - Increased rest - Increasing Fluids - Acetaminophen / ibuprofen as needed for fever/pain.  - Salt water gargling, chloraseptic spray and throat lozenges - OTC pseudoephedrine or coricidin if you have a history of high blood pressure or take blood pressure medications - Mucinex.  - Saline sinus flushes or a neti pot.  - Humidifying the air.

## 2021-05-21 LAB — NOVEL CORONAVIRUS, NAA: SARS-CoV-2, NAA: NOT DETECTED

## 2021-05-21 LAB — SARS-COV-2, NAA 2 DAY TAT

## 2021-05-26 ENCOUNTER — Telehealth: Payer: Self-pay | Admitting: Licensed Clinical Social Worker

## 2021-05-26 NOTE — Telephone Encounter (Signed)
Referred by Robin Martinique due to overwhelm.

## 2021-05-30 ENCOUNTER — Other Ambulatory Visit: Payer: Self-pay | Admitting: Family Medicine

## 2021-05-30 NOTE — Telephone Encounter (Signed)
Requested medication (s) are due for refill today: yes  Requested medication (s) are on the active medication list: yes  Last refill:  04/12/20 #10 tab with 11 refills  Future visit scheduled: no  Notes to clinic:  needs  appointment    Requested Prescriptions  Pending Prescriptions Disp Refills   SUMAtriptan (IMITREX) 100 MG tablet [Pharmacy Med Name: SUMATRIPTAN SUCC 100 MG TABLET] 10 tablet 11    Sig: TAKE 1 TABLET BY MOUTH EVERY 2 (TWO) HOURS AS NEEDED FOR MIGRAINE (MAX 2 A DAY). MAY REPEAT IN 2 HOURS IF HEADACHE PERSISTS     Neurology:  Migraine Therapy - Triptan Passed - 05/30/2021  3:47 PM      Passed - Last BP in normal range    BP Readings from Last 1 Encounters:  05/20/21 137/84          Passed - Valid encounter within last 12 months    Recent Outpatient Visits           1 week ago Upper respiratory tract infection, unspecified type   [facility], Lauren A, NP   12 months ago Suspected COVID-19 virus infection   [facility] [place], [place], DO   1 year ago Neoplasm of uncertain behavior of skin   [place], Megan P, DO   1 year ago Routine general medical examination at a health care facility   [facility], [place], DO   1 year ago Subconjunctival hemorrhage of right eye   [place], [place], DO

## 2021-05-31 NOTE — Telephone Encounter (Signed)
Spoke with pt and she says she has to call back to make the appt.

## 2021-06-03 NOTE — Telephone Encounter (Signed)
Pt is waiting for test results

## 2021-06-04 ENCOUNTER — Encounter: Payer: Self-pay | Admitting: Internal Medicine

## 2021-06-04 ENCOUNTER — Telehealth (INDEPENDENT_AMBULATORY_CARE_PROVIDER_SITE_OTHER): Payer: BC Managed Care – PPO | Admitting: Internal Medicine

## 2021-06-04 ENCOUNTER — Ambulatory Visit: Payer: Self-pay

## 2021-06-04 VITALS — Temp 99.6°F

## 2021-06-04 DIAGNOSIS — J329 Chronic sinusitis, unspecified: Secondary | ICD-10-CM | POA: Diagnosis not present

## 2021-06-04 MED ORDER — FEXOFENADINE HCL 180 MG PO TABS: 180.0000 mg | ORAL_TABLET | Freq: Every day | ORAL | 1 refills | Status: AC

## 2021-06-04 MED ORDER — BENZONATATE 100 MG PO CAPS: 100.0000 mg | ORAL_CAPSULE | Freq: Two times a day (BID) | ORAL | 0 refills | Status: DC | PRN

## 2021-06-04 MED ORDER — CHERATUSSIN AC 100-10 MG/5ML PO SOLN: 5.0000 mL | Freq: Every evening | ORAL | 0 refills | Status: DC

## 2021-06-04 MED ORDER — DOXYCYCLINE HYCLATE 100 MG PO TABS: 100.0000 mg | ORAL_TABLET | Freq: Two times a day (BID) | ORAL | 0 refills | Status: AC

## 2021-06-04 NOTE — Telephone Encounter (Signed)
° °  Chief Complaint: Productive cough with green mucus Symptoms: Cough, congestion, wheezing Frequency: Started Tuesday. Pertinent Negatives: Patient denies shortness of breath. Disposition: [] ED /[] Urgent Care (no appt availability in office) / [] Appointment(In office/virtual)/ []  [facility]/ [] Home Care/ [] Refused Recommended Disposition /[] [facility]/ []  Follow-up with PCP Additional Notes: Declines appointment. Treated 05/21/21 with antibiotic. "Never really got over it." Had negative COVID and flu test at [facility] this week. Asking for another antibiotic. Declines visit. Please advise pt.   Answer Assessment - Initial Assessment Questions 1. ONSET: "When did the cough begin?"      Tuesday 2. SEVERITY: "How bad is the cough today?"      Severe 3. SPUTUM: "Describe the color of your sputum" (none, dry cough; clear, white, yellow, green)     Green 4. HEMOPTYSIS: "Are you coughing up any blood?" If so ask: "How much?" (flecks, streaks, tablespoons, etc.)     No 5. DIFFICULTY BREATHING: "Are you having difficulty breathing?" If Yes, ask: "How bad is it?" (e.g., mild, moderate, severe)    - MILD: No SOB at rest, mild SOB with walking, speaks normally in sentences, can lie down, no retractions, pulse < 100.    - MODERATE: SOB at rest, SOB with minimal exertion and prefers to sit, cannot lie down flat, speaks in phrases, mild retractions, audible wheezing, pulse 100-120.    - SEVERE: Very SOB at rest, speaks in single words, struggling to breathe, sitting hunched forward, retractions, pulse > 120      No 6. FEVER: "Do you have a fever?" If Yes, ask: "What is your temperature, how was it measured, and when did it start?"     Wednesday  100.3 7. CARDIAC HISTORY: "Do you have any history of heart disease?" (e.g., heart attack, congestive heart failure)      No 8. LUNG HISTORY: "Do you have any history of lung disease?"  (e.g., pulmonary embolus, asthma, emphysema)      No 9. PE RISK FACTORS: "Do you have a history of blood clots?" (or: recent major surgery, recent prolonged travel, bedridden)     No 10. OTHER SYMPTOMS: "Do you have any other symptoms?" (e.g., runny nose, wheezing, chest pain)       Wheezing 11. PREGNANCY: "Is there any chance you are pregnant?" "When was your last menstrual period?"       No 12. TRAVEL: "Have you traveled out of the country in the last month?" (e.g., travel history, exposures)       No  Protocols used: Cough - Acute Productive-A-AH

## 2021-06-04 NOTE — Telephone Encounter (Signed)
Appt scheduled

## 2021-06-04 NOTE — Progress Notes (Signed)
Temp 99.6 F (37.6 C) (Temporal)    Subjective:    Patient ID: Cheryl Dawson, female    DOB: Mar 26, 1966, 56 y.o.   MRN: [id number]  Chief Complaint  Patient presents with   Cough    Started on Sunday, purulent cough. Granddaughter tested positive on Monday ,tested for Covid on Tuesday and Thursday they were negative.    Headache   Sinus pressure    HPI: Cheryl Dawson is a 56 y.o. female   This visit was completed via video visit through MyChart due to the restrictions of the COVID-19 pandemic. All issues as above were discussed and addressed. Physical exam was done as above through visual confirmation on video through MyChart. If it was felt that the patient should be evaluated in the office, they were directed there. The patient verbally consented to this visit. Location of the patient: home Location of the provider: work Those involved with this call:  Provider: Charlynne Cousins, MD CMA: Frazier Butt, [place] Desk/Registration: [employer]  Time spent on call: 10 minutes with patient face to face via video conference. More than 50% of this time was spent in counseling and coordination of care. 10 minutes total spent in review of patient's record and preparation of their chart.  Gradnkid has COVID she takes care of the grandkid as she has custody of her.   Symptoms started x 2 days ago - was around the grandkid ,and pt  tested twice all negative .  Cough This is a recurrent problem. Associated symptoms include headaches and a sore throat. Pertinent negatives include no fever.  Headache  This is a new problem. The current episode started in the past 7 days. The problem occurs intermittently. The problem has been waxing and waning. Associated symptoms include coughing, sinus pressure and a sore throat. Pertinent negatives include no abdominal pain, abnormal behavior, anorexia, back pain, blurred vision, facial sweating, fever, hearing loss, insomnia, loss of balance, muscle  aches, scalp tenderness, seizures, swollen glands or tingling.   Chief Complaint  Patient presents with   Cough    Started on Sunday, purulent cough. Granddaughter tested positive on Monday ,tested for Covid on Tuesday and Thursday they were negative.    Headache   Sinus pressure    Relevant past medical, surgical, family and social history reviewed and updated as indicated. Interim medical history since our last visit reviewed. Allergies and medications reviewed and updated.  Review of Systems  Constitutional:  Negative for fever.  HENT:  Positive for sinus pressure and sore throat. Negative for hearing loss.   Eyes:  Negative for blurred vision.  Respiratory:  Positive for cough.   Gastrointestinal:  Negative for abdominal pain and anorexia.  Musculoskeletal:  Negative for back pain.  Neurological:  Positive for headaches. Negative for tingling, seizures and loss of balance.  Psychiatric/Behavioral:  The patient does not have insomnia.    Per HPI unless specifically indicated above     Objective:    Temp 99.6 F (37.6 C) (Temporal)   Wt Readings from Last 3 Encounters:  05/20/21 140 lb (63.5 kg)  05/14/20 137 lb 12.8 oz (62.5 kg)  04/15/20 138 lb 6.4 oz (62.8 kg)    Physical Exam  Results for orders placed or performed in visit on 05/20/21  Novel Coronavirus, NAA ([facility])   Specimen: Nasopharyngeal(NP) swabs in vial transport medium  Result Value Ref Range   SARS-CoV-2, NAA Not Detected Not Detected  SARS-COV-2, NAA 2 DAY TAT  Result  Value Ref Range   SARS-CoV-2, NAA 2 DAY TAT Performed   Veritor Flu A/B Waived  Result Value Ref Range   Influenza A Negative Negative   Influenza B Negative Negative        Current Outpatient Medications:    clobetasol ointment (TEMOVATE) 0.05 %, APPLY TO AFFECTED AREA TWICE A DAY, Disp: 60 g, Rfl: 1   SUMAtriptan (IMITREX) 100 MG tablet, Take 1 tablet (100 mg total) by mouth every 2 (two) hours as needed for migraine (max  2 a day). May repeat in 2 hours if headache persists, Disp: 10 tablet, Rfl: 11   valACYclovir (VALTREX) 500 MG tablet, Take one tab po BID x 7 days at onset of outbreak., Disp: 30 tablet, Rfl: 11    Acute sinusitis : start pt on doxycycline as allergic to penicillins abnd failed zpak pt advised to take Tylenol q 4- 6 hourly as needed. pt to take allegra q pm as needed and to call office if symptoms worsened pt verbalised understanding of such.     Problem List Items Addressed This Visit   None    No orders of the defined types were placed in this encounter.    No orders of the defined types were placed in this encounter.    Follow up plan: No follow-ups on file.

## 2021-06-17 ENCOUNTER — Ambulatory Visit: Payer: Self-pay | Admitting: Licensed Clinical Social Worker

## 2021-07-01 ENCOUNTER — Telehealth: Payer: Self-pay | Admitting: Licensed Clinical Social Worker

## 2021-07-01 ENCOUNTER — Ambulatory Visit: Payer: Self-pay | Admitting: Licensed Clinical Social Worker

## 2021-07-01 NOTE — Telephone Encounter (Signed)
Pt. Left vm needing to cancel appt.

## 2021-07-06 ENCOUNTER — Other Ambulatory Visit: Payer: Self-pay | Admitting: Internal Medicine

## 2021-07-06 DIAGNOSIS — Z1231 Encounter for screening mammogram for malignant neoplasm of breast: Secondary | ICD-10-CM

## 2021-08-11 ENCOUNTER — Other Ambulatory Visit: Payer: Self-pay

## 2021-08-11 ENCOUNTER — Ambulatory Visit
Admission: RE | Admit: 2021-08-11 | Discharge: 2021-08-11 | Disposition: A | Discharge status: Home / Self Care | Payer: BC Managed Care – PPO | Source: Ambulatory Visit | Attending: Internal Medicine | Admitting: Internal Medicine

## 2021-08-11 DIAGNOSIS — Z1231 Encounter for screening mammogram for malignant neoplasm of breast: Secondary | ICD-10-CM | POA: Insufficient documentation

## 2021-08-12 NOTE — Progress Notes (Signed)
Please let pt know this was normal.

## 2021-11-11 ENCOUNTER — Telehealth: Payer: Self-pay | Admitting: Internal Medicine

## 2021-11-11 NOTE — Telephone Encounter (Signed)
Copied from [place] [id number]. Topic: Complaint - Billing/Coding >> Nov 11, 2021  9:04 AM Cyndi Bender wrote: DOS: 06/03/20 Details of complaint: Patient received bill for $255. Patient stated the visit was not filed with her insurance. Patient also stated she was notified that the claim was filed on 07/28/21 but it was denied and patient feels like it was denied due to the amount of time that had passed. How would the patient like to see this issue resolved? Patient would like this claim filed correctly so this can be resolved   Route to Engineer, building services.

## 2021-12-02 NOTE — Telephone Encounter (Signed)
Reviewed patient's account and contacted patient to advise charge was written off due to timely filing.

## 2022-04-01 ENCOUNTER — Ambulatory Visit (INDEPENDENT_AMBULATORY_CARE_PROVIDER_SITE_OTHER): Payer: BC Managed Care – PPO | Admitting: Family Medicine

## 2022-04-01 ENCOUNTER — Encounter: Payer: Self-pay | Admitting: Family Medicine

## 2022-04-01 VITALS — BP 132/83 | HR 64 | Temp 98.2°F | Ht 64.0 in | Wt 147.6 lb

## 2022-04-01 DIAGNOSIS — Z23 Encounter for immunization: Secondary | ICD-10-CM

## 2022-04-01 DIAGNOSIS — Z0000 Encounter for general adult medical examination without abnormal findings: Secondary | ICD-10-CM

## 2022-04-01 DIAGNOSIS — B009 Herpesviral infection, unspecified: Secondary | ICD-10-CM

## 2022-04-01 DIAGNOSIS — Z1231 Encounter for screening mammogram for malignant neoplasm of breast: Secondary | ICD-10-CM | POA: Diagnosis not present

## 2022-04-01 LAB — URINALYSIS, ROUTINE W REFLEX MICROSCOPIC
Bilirubin, UA: NEGATIVE
Glucose, UA: NEGATIVE
Ketones, UA: NEGATIVE
Nitrite, UA: NEGATIVE
Protein,UA: NEGATIVE
Specific Gravity, UA: 1.01 (ref 1.005–1.030)
Urobilinogen, Ur: 0.2 mg/dL (ref 0.2–1.0)
pH, UA: 6 (ref 5.0–7.5)

## 2022-04-01 LAB — MICROSCOPIC EXAMINATION: Bacteria, UA: NONE SEEN

## 2022-04-01 MED ORDER — SUMATRIPTAN SUCCINATE 100 MG PO TABS: 100.0000 mg | ORAL_TABLET | ORAL | 11 refills | Status: DC | PRN

## 2022-04-01 MED ORDER — CLOBETASOL PROPIONATE 0.05 % EX OINT: TOPICAL_OINTMENT | CUTANEOUS | 1 refills | Status: DC

## 2022-04-01 MED ORDER — MUPIROCIN 2 % EX OINT
1.0000 | TOPICAL_OINTMENT | Freq: Two times a day (BID) | CUTANEOUS | 0 refills | Status: DC
Start: 2022-04-01 — End: 2023-04-03

## 2022-04-01 MED ORDER — VALACYCLOVIR HCL 500 MG PO TABS: ORAL_TABLET | ORAL | 11 refills | Status: DC

## 2022-04-01 NOTE — Patient Instructions (Signed)
Please call to schedule your mammogram and/or bone density: [facility]  Address: [address], [place], [place] [address] Phone: [phone]  [facility] [place] [address] [place],  [place]  [address] Phone: [phone]   

## 2022-04-01 NOTE — Progress Notes (Signed)
BP 132/83   Pulse 64   Temp 98.2 F (36.8 C)   Ht '5\' 4"'$  (1.626 m)   Wt 147 lb 9.6 oz (67 kg)   SpO2 100%   BMI 25.34 kg/m    Subjective:    Patient ID: Cheryl Dawson, female    DOB: 1965/12/30, 56 y.o.   MRN: [id number]  HPI: Cheryl Dawson is a 56 y.o. female presenting on 04/01/2022 for comprehensive medical examination. Current medical complaints include:none  Menopausal Symptoms: yes  Depression Screen done today and results listed below:     04/01/2022   10:27 AM 06/04/2021    9:57 AM 04/15/2020    9:06 AM 03/10/2020    8:53 AM 04/15/2019    2:57 PM  Depression screen PHQ 2/9  Decreased Interest 1 0 0 0 1  Down, Depressed, Hopeless 1 0 0 0 0  PHQ - 2 Score 2 0 0 0 1  Altered sleeping 0 0  0 0  Tired, decreased energy 3 0  0 1  Change in appetite 1 0  0 1  Feeling bad or failure about yourself  0 0  0 0  Trouble concentrating 0 0  0 0  Moving slowly or fidgety/restless 1 0  0 0  Suicidal thoughts 0 0  0 0  PHQ-9 Score 7 0  0 3  Difficult doing work/chores Not difficult at all Not difficult at all  Not difficult at all      Past Medical History:  Past Medical History:  Diagnosis Date   Melanoma ([place]) 06/09/2020   L scapula - [facility]    Migraine    Undiagnosed cardiac murmurs    Uterine fibroids affecting pregnancy     Surgical History:  Past Surgical History:  Procedure Laterality Date   ABDOMINAL HYSTERECTOMY     CESAREAN SECTION     x2   COLONOSCOPY WITH PROPOFOL N/A 04/26/2019   Procedure: COLONOSCOPY WITH PROPOFOL;  Surgeon: Virgel Manifold, MD;  Location: [facility];  Service: Endoscopy;  Laterality: N/A;   DOPPLER ECHOCARDIOGRAPHY  2006   tubes reversed      Medications:  Current Outpatient Medications on File Prior to Visit  Medication Sig   fexofenadine (ALLEGRA ALLERGY) 180 MG tablet Take 1 tablet (180 mg total) by mouth daily.   No current facility-administered medications on file prior to visit.    Allergies:  Allergies   Allergen Reactions   Penicillins Hives   Sulfa Antibiotics Rash    Social History:  Social History   Socioeconomic History   Marital status: Married    Spouse name: Not on file   Number of children: Not on file   Years of education: Not on file   Highest education level: Not on file  Occupational History   Not on file  Tobacco Use   Smoking status: Never   Smokeless tobacco: Never  Vaping Use   Vaping Use: Never used  Substance and Sexual Activity   Alcohol use: Yes    Alcohol/week: 0.0 standard drinks of alcohol    Comment: rare   Drug use: No   Sexual activity: Not on file  Other Topics Concern   Not on file  Social History Narrative   Not on file   Social Determinants of Health   Financial Resource Strain: Not on file  Food Insecurity: Not on file  Transportation Needs: Not on file  Physical Activity: Not on file  Stress: Not on file  Social Connections: Not on file  Intimate Partner Violence: Not on file   Social History   Tobacco Use  Smoking Status Never  Smokeless Tobacco Never   Social History   Substance and Sexual Activity  Alcohol Use Yes   Alcohol/week: 0.0 standard drinks of alcohol   Comment: rare    Family History:  Family History  Problem Relation Age of Onset   Aneurysm Mother    Diabetes Father    Heart disease Sister    Heart disease Brother    Stroke Brother    Breast cancer Maternal Aunt     Past medical history, surgical history, medications, allergies, family history and social history reviewed with patient today and changes made to appropriate areas of the chart.   Review of Systems  Constitutional:  Positive for diaphoresis. Negative for chills, fever, malaise/fatigue and weight loss.  HENT: Negative.    Eyes: Negative.   Respiratory: Negative.    Cardiovascular:  Positive for leg swelling. Negative for chest pain, palpitations, orthopnea, claudication and PND.  Gastrointestinal:  Positive for constipation. Negative  for abdominal pain, blood in stool, diarrhea, heartburn, melena, nausea and vomiting.  Genitourinary: Negative.   Musculoskeletal: Negative.   Skin: Negative.        Bumps on the back of her head  Neurological: Negative.   Endo/Heme/Allergies:  Negative for environmental allergies and polydipsia. Bruises/bleeds easily.  Psychiatric/Behavioral: Negative.     All other ROS negative except what is listed above and in the HPI.      Objective:    BP 132/83   Pulse 64   Temp 98.2 F (36.8 C)   Ht '5\' 4"'$  (1.626 m)   Wt 147 lb 9.6 oz (67 kg)   SpO2 100%   BMI 25.34 kg/m   Wt Readings from Last 3 Encounters:  04/01/22 147 lb 9.6 oz (67 kg)  05/20/21 140 lb (63.5 kg)  05/14/20 137 lb 12.8 oz (62.5 kg)    Physical Exam Vitals and nursing note reviewed.  Constitutional:      General: She is not in acute distress.    Appearance: Normal appearance. She is normal weight. She is not ill-appearing, toxic-appearing or diaphoretic.  HENT:     Head: Normocephalic and atraumatic.     Right Ear: Tympanic membrane, ear canal and external ear normal. There is no impacted cerumen.     Left Ear: Tympanic membrane, ear canal and external ear normal. There is no impacted cerumen.     Nose: Nose normal. No congestion or rhinorrhea.     Mouth/Throat:     Mouth: Mucous membranes are moist.     Pharynx: Oropharynx is clear. No oropharyngeal exudate or posterior oropharyngeal erythema.  Eyes:     General: No scleral icterus.       Right eye: No discharge.        Left eye: No discharge.     Extraocular Movements: Extraocular movements intact.     Conjunctiva/sclera: Conjunctivae normal.     Pupils: Pupils are equal, round, and reactive to light.  Neck:     Vascular: No carotid bruit.  Cardiovascular:     Rate and Rhythm: Normal rate and regular rhythm.     Pulses: Normal pulses.     Heart sounds: No murmur heard.    No friction rub. No gallop.  Pulmonary:     Effort: Pulmonary effort is normal.  No respiratory distress.     Breath sounds: Normal breath sounds. No stridor. No  wheezing, rhonchi or rales.  Chest:     Chest wall: No tenderness.  Abdominal:     General: Abdomen is flat. Bowel sounds are normal. There is no distension.     Palpations: Abdomen is soft. There is no mass.     Tenderness: There is no abdominal tenderness. There is no right CVA tenderness, left CVA tenderness, guarding or rebound.     Hernia: No hernia is present.  Genitourinary:    Comments: Breast and pelvic exams deferred with shared decision making Musculoskeletal:        General: No swelling, tenderness, deformity or signs of injury.     Cervical back: Normal range of motion and neck supple. No rigidity. No muscular tenderness.     Right lower leg: No edema.     Left lower leg: No edema.  Lymphadenopathy:     Cervical: No cervical adenopathy.  Skin:    General: Skin is warm and dry.     Capillary Refill: Capillary refill takes less than 2 seconds.     Coloration: Skin is not jaundiced or pale.     Findings: No bruising, erythema, lesion or rash.  Neurological:     General: No focal deficit present.     Mental Status: She is alert and oriented to person, place, and time. Mental status is at baseline.     Cranial Nerves: No cranial nerve deficit.     Sensory: No sensory deficit.     Motor: No weakness.     Coordination: Coordination normal.     Gait: Gait normal.     Deep Tendon Reflexes: Reflexes normal.  Psychiatric:        Mood and Affect: Mood normal.        Behavior: Behavior normal.        Thought Content: Thought content normal.        Judgment: Judgment normal.     Results for orders placed or performed in visit on 04/01/22  Microscopic Examination   Urine  Result Value Ref Range   WBC, UA 0-5 0 - 5 /hpf   RBC, Urine 0-2 0 - 2 /hpf   Epithelial Cells (non renal) 0-10 0 - 10 /hpf   Bacteria, UA None seen None seen/Few  Urinalysis, Routine w reflex microscopic  Result Value Ref  Range   Specific Gravity, UA 1.010 1.005 - 1.030   pH, UA 6.0 5.0 - 7.5   Color, UA Yellow Yellow   Appearance Ur Clear Clear   Leukocytes,UA 1+ (A) Negative   Protein,UA Negative Negative/Trace   Glucose, UA Negative Negative   Ketones, UA Negative Negative   RBC, UA Trace (A) Negative   Bilirubin, UA Negative Negative   Urobilinogen, Ur 0.2 0.2 - 1.0 mg/dL   Nitrite, UA Negative Negative   Microscopic Examination See below:       Assessment & Plan:   Problem List Items Addressed This Visit   None Visit Diagnoses     Routine general medical examination at a health care facility    -  Primary   Vaccines up to date. Screening labs checked today. Pap N/A. Mammo and colonoscopy up to date. Continue diet and exercise. Call with any concerns.   Relevant Orders   CBC with Differential/Platelet   Comprehensive metabolic panel   Lipid Panel w/o Chol/HDL Ratio   Urinalysis, Routine w reflex microscopic (Completed)   TSH   Need for influenza vaccination       Flu shot given today.  Relevant Orders   Flu Vaccine QUAD 6+ mos PF IM (Fluarix Quad PF)   Encounter for screening mammogram for malignant neoplasm of breast       Relevant Orders   MM 3D SCREEN BREAST BILATERAL   Herpes simplex       Relevant Medications   valACYclovir (VALTREX) 500 MG tablet   mupirocin ointment (BACTROBAN) 2 %        Follow up plan: Return in about 1 year (around 04/02/2023) for physical.   LABORATORY TESTING:  - Pap smear: not applicable  IMMUNIZATIONS:   - Tdap: Tetanus vaccination status reviewed: last tetanus booster within 10 years. - Influenza: Administered today - Pneumovax: Not applicable - Prevnar: Not applicable - COVID: Up to date - HPV: Not applicable - Shingrix vaccine: Refused  SCREENING: -Mammogram: Up to date  - Colonoscopy: Up to date   PATIENT COUNSELING:   Advised to take 1 mg of folate supplement per day if capable of pregnancy.   Sexuality: Discussed sexually  transmitted diseases, partner selection, use of condoms, avoidance of unintended pregnancy  and contraceptive alternatives.   Advised to avoid cigarette smoking.  I discussed with the patient that most people either abstain from alcohol or drink within safe limits (<=14/week and <=4 drinks/occasion for males, <=7/weeks and <= 3 drinks/occasion for females) and that the risk for alcohol disorders and other health effects rises proportionally with the number of drinks per week and how often a drinker exceeds daily limits.  Discussed cessation/primary prevention of drug use and availability of treatment for abuse.   Diet: Encouraged to adjust caloric intake to maintain  or achieve ideal body weight, to reduce intake of dietary saturated fat and total fat, to limit sodium intake by avoiding high sodium foods and not adding table salt, and to maintain adequate dietary potassium and calcium preferably from fresh fruits, vegetables, and low-fat dairy products.    stressed the importance of regular exercise  Injury prevention: Discussed safety belts, safety helmets, smoke detector, smoking near bedding or upholstery.   Dental health: Discussed importance of regular tooth brushing, flossing, and dental visits.    NEXT PREVENTATIVE PHYSICAL DUE IN 1 YEAR. Return in about 1 year (around 04/02/2023) for physical.

## 2022-04-02 LAB — CBC WITH DIFFERENTIAL/PLATELET
Basophils Absolute: 0 10*3/uL (ref 0.0–0.2)
Basos: 1 %
EOS (ABSOLUTE): 0 10*3/uL (ref 0.0–0.4)
Eos: 0 %
Hematocrit: 44.2 % (ref 34.0–46.6)
Hemoglobin: 14.8 g/dL (ref 11.1–15.9)
Immature Grans (Abs): 0 10*3/uL (ref 0.0–0.1)
Immature Granulocytes: 0 %
Lymphocytes Absolute: 1.8 10*3/uL (ref 0.7–3.1)
Lymphs: 38 %
MCH: 29.2 pg (ref 26.6–33.0)
MCHC: 33.5 g/dL (ref 31.5–35.7)
MCV: 87 fL (ref 79–97)
Monocytes Absolute: 0.3 10*3/uL (ref 0.1–0.9)
Monocytes: 7 %
Neutrophils Absolute: 2.6 10*3/uL (ref 1.4–7.0)
Neutrophils: 54 %
Platelets: 217 10*3/uL (ref 150–450)
RBC: 5.07 x10E6/uL (ref 3.77–5.28)
RDW: 13.2 % (ref 11.7–15.4)
WBC: 4.8 10*3/uL (ref 3.4–10.8)

## 2022-04-02 LAB — COMPREHENSIVE METABOLIC PANEL
ALT: 12 IU/L (ref 0–32)
AST: 12 IU/L (ref 0–40)
Albumin/Globulin Ratio: 2 (ref 1.2–2.2)
Albumin: 4.5 g/dL (ref 3.8–4.9)
Alkaline Phosphatase: 70 IU/L (ref 44–121)
BUN/Creatinine Ratio: 20 (ref 9–23)
BUN: 16 mg/dL (ref 6–24)
Bilirubin Total: 0.5 mg/dL (ref 0.0–1.2)
CO2: 25 mmol/L (ref 20–29)
Calcium: 9.6 mg/dL (ref 8.7–10.2)
Chloride: 104 mmol/L (ref 96–106)
Creatinine, Ser: 0.79 mg/dL (ref 0.57–1.00)
Globulin, Total: 2.2 g/dL (ref 1.5–4.5)
Glucose: 82 mg/dL (ref 70–99)
Potassium: 4.2 mmol/L (ref 3.5–5.2)
Sodium: 141 mmol/L (ref 134–144)
Total Protein: 6.7 g/dL (ref 6.0–8.5)
eGFR: 89 mL/min/{1.73_m2} (ref >59)

## 2022-04-02 LAB — LIPID PANEL W/O CHOL/HDL RATIO
Cholesterol, Total: 143 mg/dL (ref 100–199)
HDL: 63 mg/dL (ref >39)
LDL Chol Calc (NIH): 70 mg/dL (ref 0–99)
Triglycerides: 45 mg/dL (ref 0–149)
VLDL Cholesterol Cal: 10 mg/dL (ref 5–40)

## 2022-04-02 LAB — TSH: TSH: 1.41 u[IU]/mL (ref 0.450–4.500)

## 2022-04-18 IMAGING — MG MM DIGITAL SCREENING BILAT W/ TOMO AND CAD
6 of 10 series · 6 of 30 positions shown · non-contrast
Comparison: Previous exam(s).

CLINICAL DATA: Screening.

EXAM:
DIGITAL SCREENING BILATERAL MAMMOGRAM WITH TOMOSYNTHESIS AND CAD
TECHNIQUE: Bilateral screening digital craniocaudal and mediolateral oblique
mammograms were obtained. Bilateral screening digital breast
tomosynthesis was performed. The images were evaluated with
computer-aided detection.

[R CC synth-2D (1 of 2)]
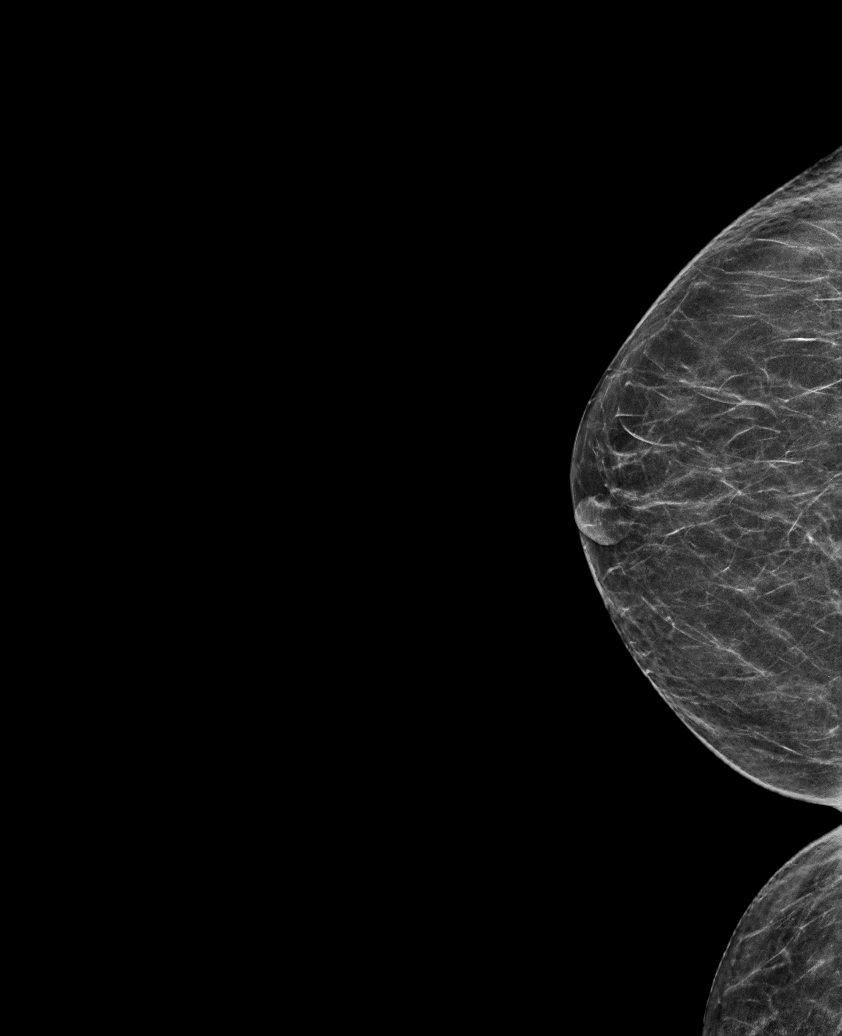

[R CC synth-2D (2 of 2)]
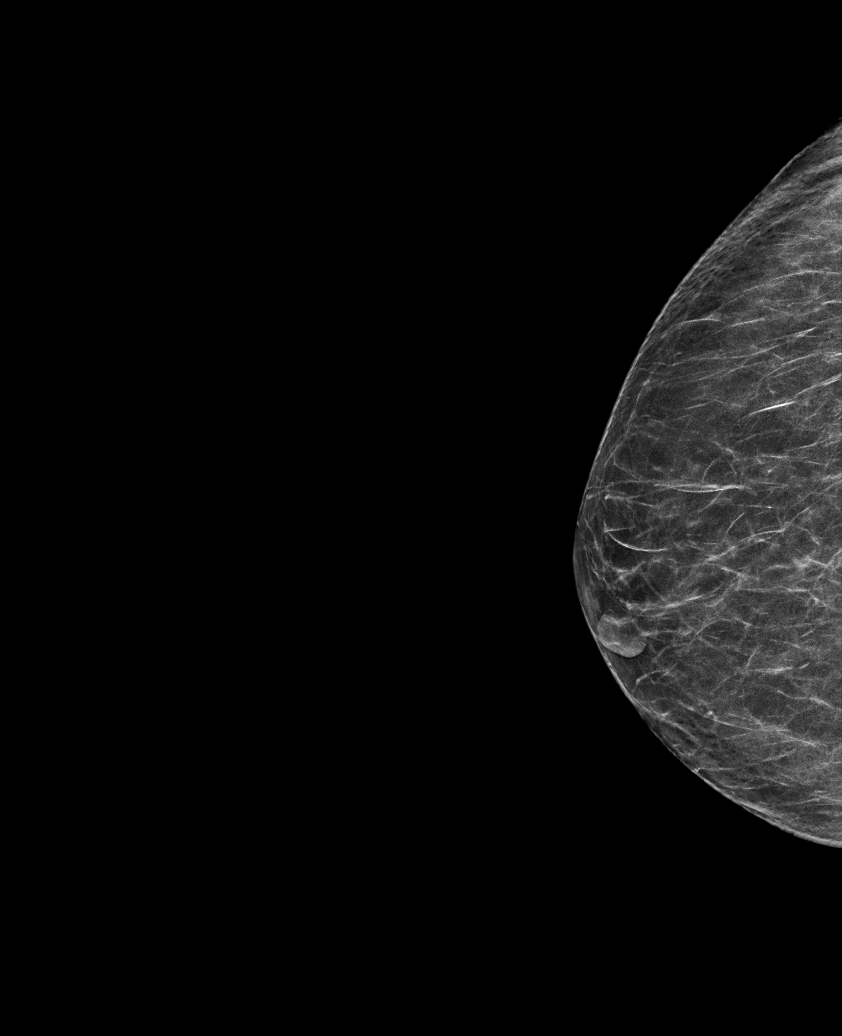

[L CC synth-2D]
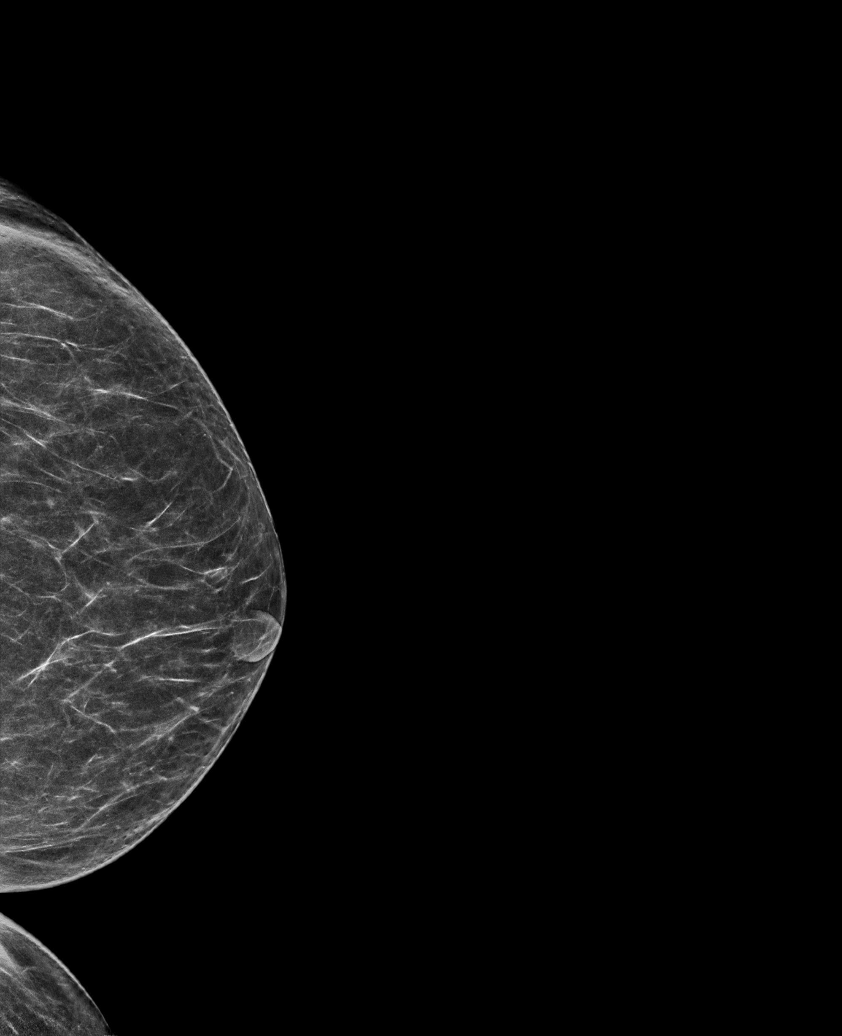

[L MLO synth-2D]
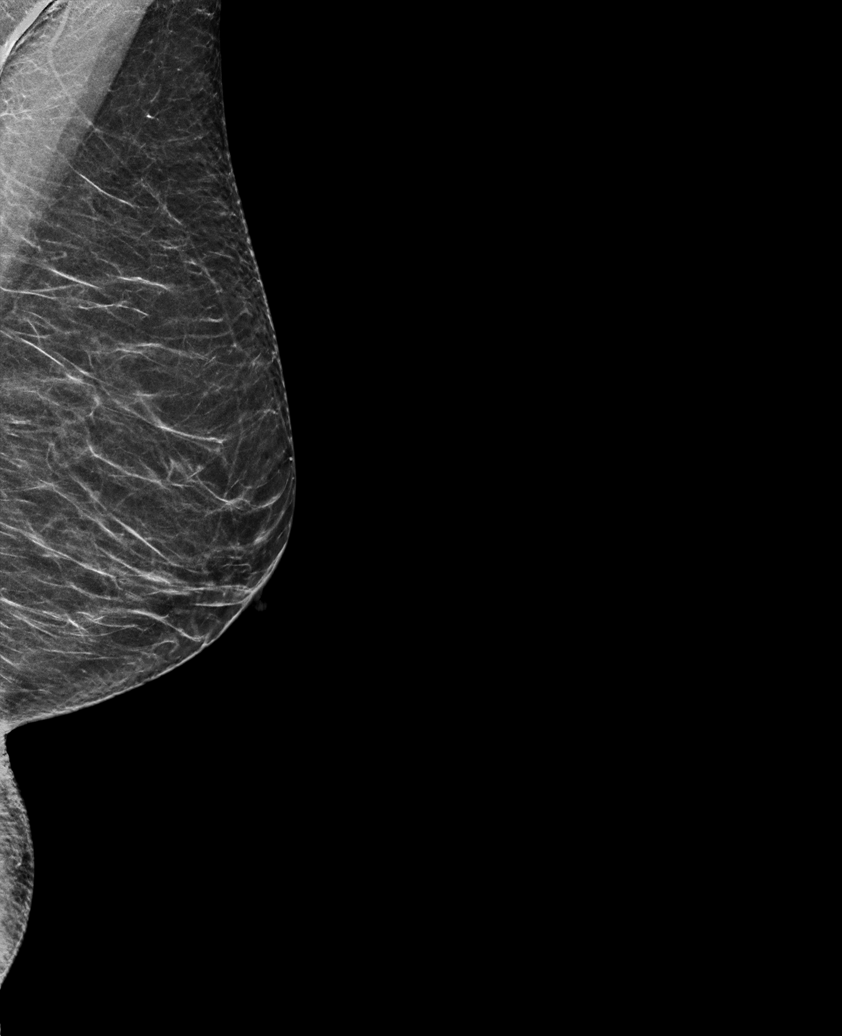

[R MLO synth-2D]
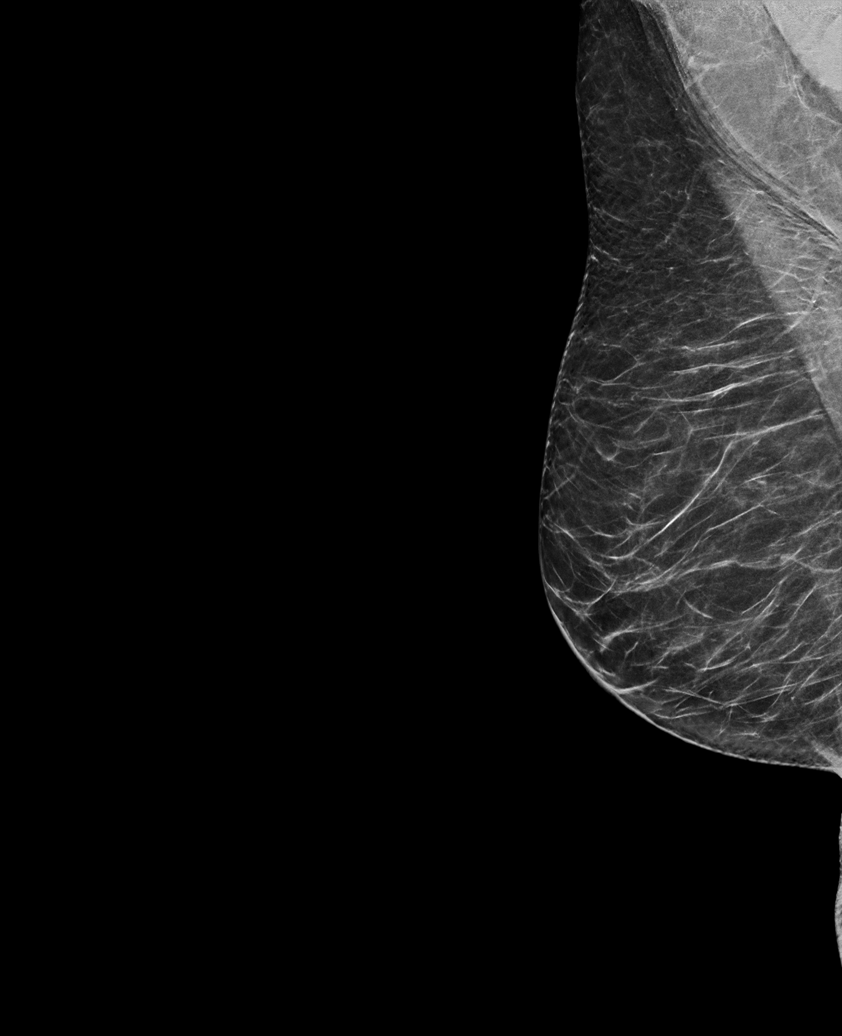

[R CC tomo · tomo slice 29/56.0]
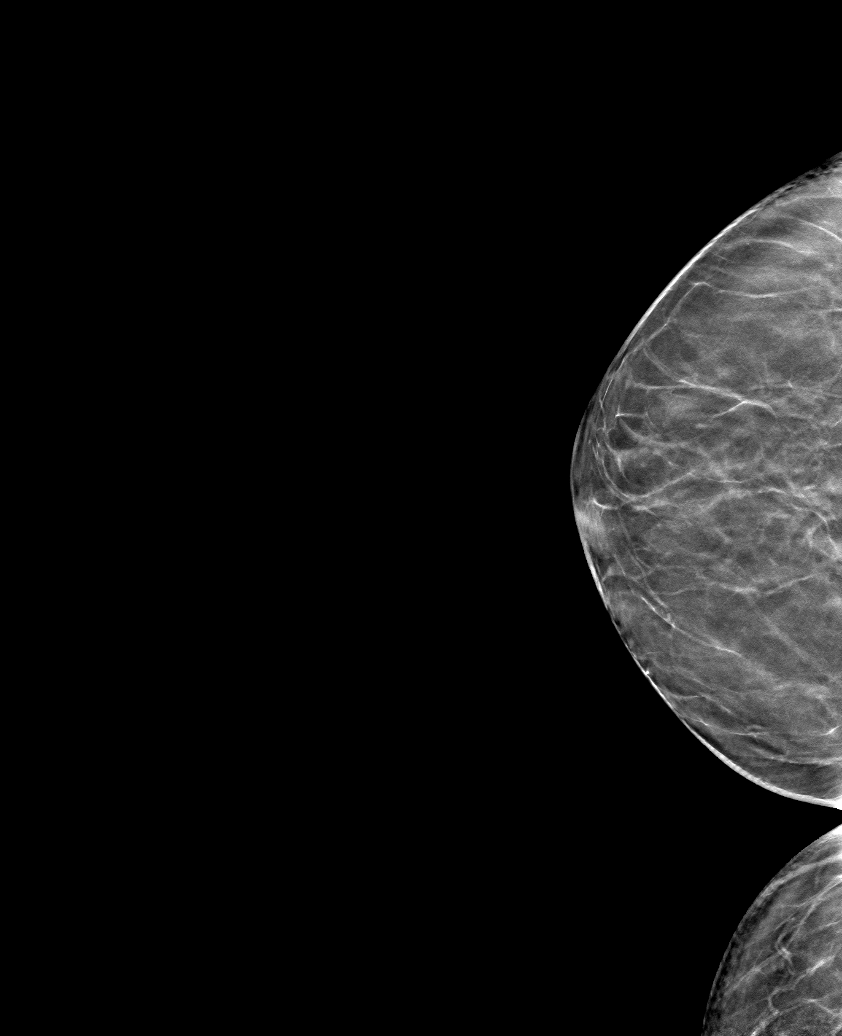

[6 of 30 positions shown; findings below may reference images not displayed]

ACR Breast Density Category b: There are scattered areas of
fibroglandular density.
FINDINGS: There are no findings suspicious for malignancy.
IMPRESSION: No mammographic evidence of malignancy. A result letter of this
screening mammogram will be mailed directly to the patient.

RECOMMENDATION:
Screening mammogram in one year. (Code:51-O-LD2)

BI-RADS CATEGORY  1: Negative.

## 2022-06-06 ENCOUNTER — Telehealth: Payer: Self-pay | Admitting: Family Medicine

## 2022-06-06 NOTE — Telephone Encounter (Signed)
Copied from [place] [id number]. Topic: General - Inquiry >> Jun 06, 2022  2:30 PM Penni Bombard wrote: Reason for CRM: pt was in on 04/01/22 for CPE and she had UA that was coded incorrectly.  She said they did not tel her how it should be coded but that they could not pay the way it is currently.  CB@  [phone]

## 2022-06-06 NOTE — Telephone Encounter (Signed)
Copied from [place] [id number]. Topic: General - Inquiry >> Jun 06, 2022  2:30 PM Penni Bombard wrote: Reason for CRM: pt was in on 04/01/22 for CPE and she had UA that was coded incorrectly.  She said they did not tel her how it should be coded but that they could not pay the way it is currently.  CB@  [phone]

## 2022-06-20 NOTE — Telephone Encounter (Signed)
Called patient and advised that [facility] will be contacted regarding coding correction and claim reprocessing. Patient acknowledged understanding.

## 2022-09-07 ENCOUNTER — Ambulatory Visit
Admission: RE | Admit: 2022-09-07 | Discharge: 2022-09-07 | Disposition: A | Discharge status: Home / Self Care | Payer: BC Managed Care – PPO | Source: Ambulatory Visit | Attending: Family Medicine | Admitting: Family Medicine

## 2022-09-07 DIAGNOSIS — Z1231 Encounter for screening mammogram for malignant neoplasm of breast: Secondary | ICD-10-CM | POA: Insufficient documentation

## 2023-01-04 ENCOUNTER — Other Ambulatory Visit: Payer: Self-pay | Admitting: Physician Assistant

## 2023-01-04 ENCOUNTER — Ambulatory Visit: Payer: BC Managed Care – PPO | Admitting: Physician Assistant

## 2023-01-04 VITALS — BP 136/88 | HR 67 | Ht 64.0 in | Wt 151.6 lb

## 2023-01-04 DIAGNOSIS — N951 Menopausal and female climacteric states: Secondary | ICD-10-CM

## 2023-01-04 MED ORDER — PAROXETINE MESYLATE 7.5 MG PO CAPS: 7.5000 mg | ORAL_CAPSULE | Freq: Every day | ORAL | 2 refills | Status: DC

## 2023-01-04 NOTE — Assessment & Plan Note (Addendum)
Chronic, ongoing, worsening Pt reports ongoing perimenopausal symptoms that have been present since last Nov and are getting worse She states she is having hot flashes, irritability, and weight gain She is amenable to starting SSRI/SNRI therapy today - Will initiate Paxil 7.5 mg PO at bedtime today Reviewed side effects with her and answered her questions regarding medication Recommend follow up at her next scheduled apt with PCP or sooner if concerns arise.

## 2023-01-04 NOTE — Progress Notes (Signed)
Acute Office Visit   Patient: Cheryl Dawson   DOB: 18-May-1966   57 y.o. Female  MRN: [id number] Visit Date: 01/04/2023  Today's healthcare provider: Oswaldo Conroy , PA-C  Introduced myself to the patient as a Secondary school teacher and provided education on APPs in clinical practice.    Chief Complaint  Patient presents with   Hot Flashes   Weight Gain   Menopause    Patient says she thinks she is going through menopausal symptoms. Patient says she first noticed about a year ago, and says it gradually gotten worse. Patient says she is having hot flashes, mood changes and weight gain. Patient says she has noticed a 10-lbs weight gain.    Subjective    HPI HPI     Menopause    Additional comments: Patient says she thinks she is going through menopausal symptoms. Patient says she first noticed about a year ago, and says it gradually gotten worse. Patient says she is having hot flashes, mood changes and weight gain. Patient says she has noticed a 10-lbs weight gain.       Last edited by Malen Gauze, CMA on 01/04/2023  9:38 AM.      Concern for Menopausal symptoms  She reports she has noticed hot flashes, weight gain, moodiness and is concerned that this is from menopause  She states her symptoms started just before she came in for her last physical and she mentioned to her provider but declined medications at that time  LMP: she is unsure- she had a partial hysterectomy years ago        01/04/2023    9:41 AM 04/01/2022   10:27 AM 06/04/2021    9:57 AM 04/15/2020    9:06 AM 03/10/2020    8:53 AM  Depression screen PHQ 2/9  Decreased Interest 1 1 0 0 0  Down, Depressed, Hopeless 1 1 0 0 0  PHQ - 2 Score 2 2 0 0 0  Altered sleeping 0 0 0  0  Tired, decreased energy 1 3 0  0  Change in appetite 0 1 0  0  Feeling bad or failure about yourself  0 0 0  0  Trouble concentrating 0 0 0  0  Moving slowly or fidgety/restless 0 1 0  0  Suicidal thoughts 0 0 0  0  PHQ-9 Score 3 7 0  0   Difficult doing work/chores Not difficult at all Not difficult at all Not difficult at all  Not difficult at all      01/04/2023    9:42 AM 04/01/2022   10:28 AM 06/04/2021    9:57 AM 05/20/2021    8:40 AM  GAD 7 : Generalized Anxiety Score  Nervous, Anxious, on Edge 1 1 0 2  Control/stop worrying 1 1  2   Worry too much - different things 1 1 0 2  Trouble relaxing 1 1 0 1  Restless 0 1 0 2  Easily annoyed or irritable 1 1 0 2  Afraid - awful might happen 0 0 0 2  Total GAD 7 Score 5 6  13   Anxiety Difficulty Somewhat difficult  Not difficult at all Not difficult at all       Medications: Outpatient Medications Prior to Visit  Medication Sig   fexofenadine (ALLEGRA ALLERGY) 180 MG tablet Take 1 tablet (180 mg total) by mouth daily.   SUMAtriptan (IMITREX) 100 MG tablet Take 1 tablet (100 mg total)  by mouth every 2 (two) hours as needed for migraine (max 2 a day). May repeat in 2 hours if headache persists   valACYclovir (VALTREX) 500 MG tablet Take one tab po BID x 7 days at onset of outbreak.   clobetasol ointment (TEMOVATE) 0.05 % APPLY TO AFFECTED AREA TWICE A DAY (Patient not taking: Reported on 01/04/2023)   mupirocin ointment (BACTROBAN) 2 % Apply 1 Application topically 2 (two) times daily. (Patient not taking: Reported on 01/04/2023)   No facility-administered medications prior to visit.    Review of Systems  Constitutional:  Positive for diaphoresis.  Psychiatric/Behavioral:  Positive for dysphoric mood and sleep disturbance.        Irritability          Objective    BP 136/88   Pulse 67   Ht 5\' 4"  (1.626 m)   Wt 151 lb 9.6 oz (68.8 kg)   SpO2 95%   BMI 26.02 kg/m     Physical Exam Vitals reviewed.  Constitutional:      General: She is awake.     Appearance: Normal appearance. She is well-developed and well-groomed.  HENT:     Head: Normocephalic and atraumatic.  Pulmonary:     Effort: Pulmonary effort is normal.  Musculoskeletal:     Cervical  back: Normal range of motion.  Skin:    General: Skin is warm and dry.  Neurological:     Mental Status: She is alert.  Psychiatric:        Attention and Perception: Attention and perception normal.        Mood and Affect: Mood and affect normal.        Speech: Speech normal.        Behavior: Behavior normal. Behavior is cooperative.       No results found for any visits on 01/04/23.  Assessment & Plan      No follow-ups on file.     Problem List Items Addressed This Visit       Cardiovascular and Mediastinum   Perimenopausal vasomotor symptoms - Primary    Chronic, ongoing, worsening Pt reports ongoing perimenopausal symptoms that have been present since last Nov and are getting worse She states she is having hot flashes, irritability, and weight gain She is amenable to starting SSRI/SNRI therapy today - Will initiate Paxil 7.5 mg PO at bedtime today Reviewed side effects with her and answered her questions regarding medication Recommend follow up at her next scheduled apt with PCP or sooner if concerns arise.        Relevant Medications   PARoxetine Mesylate 7.5 MG CAPS     No follow-ups on file.   I,  E , PA-C, have reviewed all documentation for this visit. The documentation on 01/04/23 for the exam, diagnosis, procedures, and orders are all accurate and complete.   Jacquelin Hawking, MHS, PA-C [facility]

## 2023-01-05 NOTE — Telephone Encounter (Signed)
Requested medication (s) are due for refill today: no  Requested medication (s) are on the active medication list: yes  Last refill:  01/04/23  Future visit scheduled: yes  Notes to clinic:  harmacy comment: Alternative Requested:DRUG NOT COVERED BY INSURANCE.      Requested Prescriptions  Pending Prescriptions Disp Refills   PARoxetine Mesylate 7.5 MG CAPS [Pharmacy Med Name: PAROXETINE MESYLATE 7.5 MG CAP] 30 capsule 2    Sig: TAKE 1 CAPSULE BY MOUTH AT BEDTIME     Psychiatry:  Antidepressants - SSRI Passed - 01/04/2023 10:03 AM      Passed - Valid encounter within last 6 months    Recent Outpatient Visits           Yesterday Perimenopausal vasomotor symptoms   [place] [facility] Mecum, Oswaldo Conroy, PA-C   9 months ago Routine general medical examination at a health care facility   [facility] [place], [place] P, DO   1 year ago Sinusitis, unspecified chronicity, unspecified location   [place] [place] [facility] Vigg, Avanti, MD   1 year ago Upper respiratory tract infection, unspecified type   [place] [facility] McElwee, Lauren A, NP   2 years ago Suspected COVID-19 virus infection   [place] [facility] [place], [place], DO       Future Appointments             In 2 months Laural Benes, Oralia Rud, DO [facility]

## 2023-01-17 ENCOUNTER — Other Ambulatory Visit: Payer: Self-pay | Admitting: Family Medicine

## 2023-01-18 NOTE — Telephone Encounter (Signed)
Requested medication (s) are due for refill today:   Provider to review  Requested medication (s) are on the active medication list:   Yes  Future visit scheduled:   Yes 04/03/2023   Last ordered: 04/01/2022 60 g, 1 refill  Non delegated refill    Requested Prescriptions  Pending Prescriptions Disp Refills   clobetasol ointment (TEMOVATE) 0.05 % [Pharmacy Med Name: CLOBETASOL 0.05% OINTMENT] 60 g 1    Sig: APPLY TO AFFECTED AREA TWICE A DAY     Not Delegated - Dermatology:  Corticosteroids Failed - 01/17/2023  6:26 AM      Failed - This refill cannot be delegated      Passed - Valid encounter within last 12 months    Recent Outpatient Visits           2 weeks ago Perimenopausal vasomotor symptoms   [place] [facility] Mecum, Oswaldo Conroy, PA-C   9 months ago Routine general medical examination at a health care facility   [facility] [place], [place] P, DO   1 year ago Sinusitis, unspecified chronicity, unspecified location   [place] [facility] Vigg, Avanti, MD   1 year ago Upper respiratory tract infection, unspecified type   [place] [facility] McElwee, Lauren A, NP   2 years ago Suspected COVID-19 virus infection   [place] [facility] [place], [place], DO       Future Appointments             In 2 months Laural Benes, Oralia Rud, DO [facility] [facility], [facility]

## 2023-03-16 ENCOUNTER — Ambulatory Visit (INDEPENDENT_AMBULATORY_CARE_PROVIDER_SITE_OTHER): Payer: BC Managed Care – PPO | Admitting: Family Medicine

## 2023-03-16 ENCOUNTER — Encounter: Payer: Self-pay | Admitting: Family Medicine

## 2023-03-16 VITALS — BP 113/76 | HR 75 | Temp 99.0°F | Ht 64.0 in | Wt 150.2 lb

## 2023-03-16 DIAGNOSIS — J069 Acute upper respiratory infection, unspecified: Secondary | ICD-10-CM | POA: Diagnosis not present

## 2023-03-16 MED ORDER — PREDNISONE 50 MG PO TABS: 50.0000 mg | ORAL_TABLET | Freq: Every day | ORAL | 0 refills | Status: DC

## 2023-03-16 NOTE — Progress Notes (Signed)
BP 113/76   Pulse 75   Temp 99 F (37.2 C) (Oral)   Ht 5\' 4"  (1.626 m)   Wt 150 lb 3.2 oz (68.1 kg)   BMI 25.78 kg/m    Subjective:    Patient ID: Cheryl Dawson, female    DOB: 1966/04/28, 57 y.o.   MRN: [id number]  HPI: Cheryl Dawson is a 57 y.o. female  Chief Complaint  Patient presents with   Cough   Chest Congestion   Sore Throat    Patient says her symptoms started on Monday. Patient says she has been taking over the counter medications such as Alka and Delsym Cough and Sore Throat, but she has started to cough up green phlegm.    UPPER RESPIRATORY TRACT INFECTION Duration: 4 days Worst symptom: cough Fever: no Cough: yes Shortness of breath: no Wheezing: no Chest pain: yes, with cough Chest tightness: no Chest congestion: yes Nasal congestion: no Runny nose: no Post nasal drip: no Sneezing: yes Sore throat: yes Swollen glands: no Sinus pressure: no Headache: no Face pain: no Toothache: no Ear pain: no  Ear pressure: no  Eyes red/itching:no Eye drainage/crusting: no  Vomiting: no Rash: no Fatigue: yes Sick contacts: yes Strep contacts: no  Context: stable Recurrent sinusitis: no Relief with OTC cold/cough medications: no  Treatments attempted: cold/sinus, mucinex, anti-histamine, pseudoephedrine, and cough syrup   Relevant past medical, surgical, family and social history reviewed and updated as indicated. Interim medical history since our last visit reviewed. Allergies and medications reviewed and updated.  Review of Systems  Constitutional: Negative.   HENT:  Positive for congestion, rhinorrhea, sneezing and sore throat. Negative for dental problem, drooling, ear discharge, ear pain, facial swelling, hearing loss, mouth sores, nosebleeds, postnasal drip, sinus pressure, sinus pain, tinnitus, trouble swallowing and voice change.   Respiratory:  Positive for cough. Negative for apnea, choking, chest tightness, shortness of breath, wheezing and  stridor.   Cardiovascular: Negative.   Gastrointestinal: Negative.   Psychiatric/Behavioral: Negative.      Per HPI unless specifically indicated above     Objective:    BP 113/76   Pulse 75   Temp 99 F (37.2 C) (Oral)   Ht 5\' 4"  (1.626 m)   Wt 150 lb 3.2 oz (68.1 kg)   BMI 25.78 kg/m   Wt Readings from Last 3 Encounters:  03/16/23 150 lb 3.2 oz (68.1 kg)  01/04/23 151 lb 9.6 oz (68.8 kg)  04/01/22 147 lb 9.6 oz (67 kg)    Physical Exam Vitals and nursing note reviewed.  Constitutional:      General: She is not in acute distress.    Appearance: Normal appearance. She is well-developed and normal weight. She is not ill-appearing, toxic-appearing or diaphoretic.  HENT:     Head: Normocephalic and atraumatic.     Right Ear: Tympanic membrane, ear canal and external ear normal.     Left Ear: Tympanic membrane, ear canal and external ear normal.     Nose: Rhinorrhea present.     Mouth/Throat:     Mouth: Mucous membranes are moist. No oral lesions.     Pharynx: Oropharynx is clear. No pharyngeal swelling, oropharyngeal exudate, posterior oropharyngeal erythema or uvula swelling.     Tonsils: No tonsillar exudate.  Eyes:     General: No scleral icterus.       Right eye: No discharge.        Left eye: No discharge.     Extraocular Movements: Extraocular  movements intact.     Conjunctiva/sclera: Conjunctivae normal.     Pupils: Pupils are equal, round, and reactive to light.  Cardiovascular:     Rate and Rhythm: Normal rate and regular rhythm.     Pulses: Normal pulses.     Heart sounds: Normal heart sounds. No murmur heard.    No friction rub. No gallop.  Pulmonary:     Effort: Pulmonary effort is normal. No respiratory distress.     Breath sounds: Normal breath sounds. No stridor. No wheezing, rhonchi or rales.  Chest:     Chest wall: No tenderness.  Musculoskeletal:        General: Normal range of motion.     Cervical back: Normal range of motion and neck supple.   Skin:    General: Skin is warm and dry.     Capillary Refill: Capillary refill takes less than 2 seconds.     Coloration: Skin is not jaundiced or pale.     Findings: No bruising, erythema, lesion or rash.  Neurological:     General: No focal deficit present.     Mental Status: She is alert and oriented to person, place, and time. Mental status is at baseline.  Psychiatric:        Mood and Affect: Mood normal.        Behavior: Behavior normal.        Thought Content: Thought content normal.        Judgment: Judgment normal.     Results for orders placed or performed in visit on 04/01/22  Microscopic Examination   Urine  Result Value Ref Range   WBC, UA 0-5 0 - 5 /hpf   RBC, Urine 0-2 0 - 2 /hpf   Epithelial Cells (non renal) 0-10 0 - 10 /hpf   Bacteria, UA None seen None seen/Few  CBC with Differential/Platelet  Result Value Ref Range   WBC 4.8 3.4 - 10.8 x10E3/uL   RBC 5.07 3.77 - 5.28 x10E6/uL   Hemoglobin 14.8 11.1 - 15.9 g/dL   Hematocrit 17.6 16.0 - 46.6 %   MCV 87 79 - 97 fL   MCH 29.2 26.6 - 33.0 pg   MCHC 33.5 31.5 - 35.7 g/dL   RDW 73.7 10.6 - 26.9 %   Platelets 217 150 - 450 x10E3/uL   Neutrophils 54 Not Estab. %   Lymphs 38 Not Estab. %   Monocytes 7 Not Estab. %   Eos 0 Not Estab. %   Basos 1 Not Estab. %   Neutrophils Absolute 2.6 1.4 - 7.0 x10E3/uL   Lymphocytes Absolute 1.8 0.7 - 3.1 x10E3/uL   Monocytes Absolute 0.3 0.1 - 0.9 x10E3/uL   EOS (ABSOLUTE) 0.0 0.0 - 0.4 x10E3/uL   Basophils Absolute 0.0 0.0 - 0.2 x10E3/uL   Immature Granulocytes 0 Not Estab. %   Immature Grans (Abs) 0.0 0.0 - 0.1 x10E3/uL  Comprehensive metabolic panel  Result Value Ref Range   Glucose 82 70 - 99 mg/dL   BUN 16 6 - 24 mg/dL   Creatinine, Ser 4.85 0.57 - 1.00 mg/dL   eGFR 89 >46 EV/OJJ/0.09   BUN/Creatinine Ratio 20 9 - 23   Sodium 141 134 - 144 mmol/L   Potassium 4.2 3.5 - 5.2 mmol/L   Chloride 104 96 - 106 mmol/L   CO2 25 20 - 29 mmol/L   Calcium 9.6 8.7 - 10.2  mg/dL   Total Protein 6.7 6.0 - 8.5 g/dL   Albumin 4.5 3.8 -  4.9 g/dL   Globulin, Total 2.2 1.5 - 4.5 g/dL   Albumin/Globulin Ratio 2.0 1.2 - 2.2   Bilirubin Total 0.5 0.0 - 1.2 mg/dL   Alkaline Phosphatase 70 44 - 121 IU/L   AST 12 0 - 40 IU/L   ALT 12 0 - 32 IU/L  Lipid Panel w/o Chol/HDL Ratio  Result Value Ref Range   Cholesterol, Total 143 100 - 199 mg/dL   Triglycerides 45 0 - 149 mg/dL   HDL 63 >16 mg/dL   VLDL Cholesterol Cal 10 5 - 40 mg/dL   LDL Chol Calc (NIH) 70 0 - 99 mg/dL  Urinalysis, Routine w reflex microscopic  Result Value Ref Range   Specific Gravity, UA 1.010 1.005 - 1.030   pH, UA 6.0 5.0 - 7.5   Color, UA Yellow Yellow   Appearance Ur Clear Clear   Leukocytes,UA 1+ (A) Negative   Protein,UA Negative Negative/Trace   Glucose, UA Negative Negative   Ketones, UA Negative Negative   RBC, UA Trace (A) Negative   Bilirubin, UA Negative Negative   Urobilinogen, Ur 0.2 0.2 - 1.0 mg/dL   Nitrite, UA Negative Negative   Microscopic Examination See below:   TSH  Result Value Ref Range   TSH 1.410 0.450 - 4.500 uIU/mL      Assessment & Plan:   Problem List Items Addressed This Visit   None Visit Diagnoses     Upper respiratory tract infection, unspecified type    -  Primary   Strep and Flu negative. Await COVID. Will treat with prednisone. Call with any concerns. Continue to monitor.   Relevant Medications   predniSONE (DELTASONE) 50 MG tablet   Other Relevant Orders   Novel Coronavirus, NAA ([facility])   Rapid Strep screen([facility])   Influenza A & B (STAT)        Follow up plan: Return if symptoms worsen or fail to improve.

## 2023-03-18 LAB — VERITOR FLU A/B WAIVED
Influenza A: NEGATIVE
Influenza B: NEGATIVE

## 2023-03-18 LAB — CULTURE, GROUP A STREP: Strep A Culture: NEGATIVE

## 2023-03-18 LAB — RAPID STREP SCREEN (MED CTR MEBANE ONLY): Strep Gp A Ag, IA W/Reflex: NEGATIVE

## 2023-03-18 LAB — NOVEL CORONAVIRUS, NAA: SARS-CoV-2, NAA: NOT DETECTED

## 2023-03-20 ENCOUNTER — Encounter: Payer: Self-pay | Admitting: Family Medicine

## 2023-03-20 MED ORDER — DOXYCYCLINE HYCLATE 100 MG PO TABS
100.0000 mg | ORAL_TABLET | Freq: Two times a day (BID) | ORAL | 0 refills | Status: DC
Start: 2023-03-20 — End: 2023-04-03

## 2023-04-03 ENCOUNTER — Encounter: Payer: Self-pay | Admitting: Family Medicine

## 2023-04-03 ENCOUNTER — Ambulatory Visit (INDEPENDENT_AMBULATORY_CARE_PROVIDER_SITE_OTHER): Payer: BC Managed Care – PPO | Admitting: Family Medicine

## 2023-04-03 VITALS — BP 132/73 | HR 65 | Ht 63.0 in | Wt 149.0 lb

## 2023-04-03 DIAGNOSIS — N951 Menopausal and female climacteric states: Secondary | ICD-10-CM | POA: Diagnosis not present

## 2023-04-03 DIAGNOSIS — Z1231 Encounter for screening mammogram for malignant neoplasm of breast: Secondary | ICD-10-CM | POA: Diagnosis not present

## 2023-04-03 DIAGNOSIS — B009 Herpesviral infection, unspecified: Secondary | ICD-10-CM | POA: Diagnosis not present

## 2023-04-03 DIAGNOSIS — R5382 Chronic fatigue, unspecified: Secondary | ICD-10-CM | POA: Diagnosis not present

## 2023-04-03 DIAGNOSIS — Z0000 Encounter for general adult medical examination without abnormal findings: Secondary | ICD-10-CM | POA: Diagnosis not present

## 2023-04-03 MED ORDER — VALACYCLOVIR HCL 500 MG PO TABS: ORAL_TABLET | ORAL | 11 refills | Status: DC

## 2023-04-03 MED ORDER — PAROXETINE HCL 10 MG PO TABS: 10.0000 mg | ORAL_TABLET | Freq: Every day | ORAL | 1 refills | Status: DC

## 2023-04-03 MED ORDER — SUMATRIPTAN SUCCINATE 100 MG PO TABS: 100.0000 mg | ORAL_TABLET | ORAL | 11 refills | Status: DC | PRN

## 2023-04-03 NOTE — Progress Notes (Signed)
BP 132/73   Pulse 65   Ht 5\' 3"  (1.6 m)   Wt 149 lb (67.6 kg)   SpO2 97%   BMI 26.39 kg/m    Subjective:    Patient ID: Cheryl Dawson, female    DOB: [birth year], 57 y.o.   MRN: [id number]  HPI: Cheryl Dawson is a 57 y.o. female presenting on 04/03/2023 for comprehensive medical examination. Current medical complaints include:  FATIGUE Duration:  chronic Severity: moderate  Onset: gradual Context when symptoms started:  unknown Symptoms improve with rest: no  Depressive symptoms: yes Stress/anxiety: no Insomnia: no  Snoring: no Observed apnea by bed partner: no Daytime hypersomnolence:no Wakes feeling refreshed: no History of sleep study: no Dysnea on exertion:  no Orthopnea/PND: no Chest pain: no Chronic cough: no Lower extremity edema: no Arthralgias:no Myalgias: no Weakness: no Rash: no   Menopausal Symptoms: yes  Depression Screen done today and results listed below:     04/03/2023   10:52 AM 01/04/2023    9:41 AM 04/01/2022   10:27 AM 06/04/2021    9:57 AM 04/15/2020    9:06 AM  Depression screen PHQ 2/9  Decreased Interest 0 1 1 0 0  Down, Depressed, Hopeless 0 1 1 0 0  PHQ - 2 Score 0 2 2 0 0  Altered sleeping  0 0 0   Tired, decreased energy  1 3 0   Change in appetite  0 1 0   Feeling bad or failure about yourself   0 0 0   Trouble concentrating  0 0 0   Moving slowly or fidgety/restless  0 1 0   Suicidal thoughts  0 0 0   PHQ-9 Score  3 7 0   Difficult doing work/chores  Not difficult at all Not difficult at all Not difficult at all    Past Medical History:  Past Medical History:  Diagnosis Date   Melanoma (HCC) 06/09/2020   L scapula - [facility]    Migraine    Undiagnosed cardiac murmurs    Uterine fibroids affecting pregnancy     Surgical History:  Past Surgical History:  Procedure Laterality Date   ABDOMINAL HYSTERECTOMY     CESAREAN SECTION     x2   COLONOSCOPY WITH PROPOFOL N/A 04/26/2019   Procedure: COLONOSCOPY WITH PROPOFOL;   Surgeon: Pasty Spillers, MD;  Location: [facility];  Service: Endoscopy;  Laterality: N/A;   DOPPLER ECHOCARDIOGRAPHY  2006   tubes reversed      Medications:  Current Outpatient Medications on File Prior to Visit  Medication Sig   clobetasol ointment (TEMOVATE) 0.05 % APPLY TO AFFECTED AREA TWICE A DAY   fexofenadine (ALLEGRA ALLERGY) 180 MG tablet Take 1 tablet (180 mg total) by mouth daily.   No current facility-administered medications on file prior to visit.    Allergies:  Allergies  Allergen Reactions   Penicillins Hives   Sulfa Antibiotics Rash    Social History:  Social History   Socioeconomic History   Marital status: Married    Spouse name: Not on file   Number of children: Not on file   Years of education: Not on file   Highest education level: Not on file  Occupational History   Not on file  Tobacco Use   Smoking status: Never   Smokeless tobacco: Never  Vaping Use   Vaping status: Never Used  Substance and Sexual Activity   Alcohol use: Yes    Alcohol/week: 0.0 standard  drinks of alcohol    Comment: rare   Drug use: No   Sexual activity: Not on file  Other Topics Concern   Not on file  Social History Narrative   Not on file   Social Determinants of Health   Financial Resource Strain: Not on file  Food Insecurity: Not on file  Transportation Needs: Not on file  Physical Activity: Not on file  Stress: Not on file  Social Connections: Not on file  Intimate Partner Violence: Not on file   Social History   Tobacco Use  Smoking Status Never  Smokeless Tobacco Never   Social History   Substance and Sexual Activity  Alcohol Use Yes   Alcohol/week: 0.0 standard drinks of alcohol   Comment: rare    Family History:  Family History  Problem Relation Age of Onset   Aneurysm Mother    Diabetes Father    Heart disease Sister    Heart disease Brother    Stroke Brother    Breast cancer Maternal Aunt     Past medical  history, surgical history, medications, allergies, family history and social history reviewed with patient today and changes made to appropriate areas of the chart.   Review of Systems  Constitutional:  Positive for malaise/fatigue. Negative for chills, diaphoresis, fever and weight loss.  HENT: Negative.    Eyes: Negative.   Respiratory:  Positive for cough. Negative for hemoptysis, sputum production, shortness of breath and wheezing.   Cardiovascular: Negative.   Gastrointestinal: Negative.   Genitourinary: Negative.   Musculoskeletal: Negative.   Skin: Negative.   Neurological: Negative.   Endo/Heme/Allergies: Negative.   Psychiatric/Behavioral: Negative.     All other ROS negative except what is listed above and in the HPI.      Objective:    BP 132/73   Pulse 65   Ht 5\' 3"  (1.6 m)   Wt 149 lb (67.6 kg)   SpO2 97%   BMI 26.39 kg/m   Wt Readings from Last 3 Encounters:  04/03/23 149 lb (67.6 kg)  03/16/23 150 lb 3.2 oz (68.1 kg)  01/04/23 151 lb 9.6 oz (68.8 kg)    Physical Exam Vitals and nursing note reviewed.  Constitutional:      General: She is not in acute distress.    Appearance: Normal appearance. She is not ill-appearing, toxic-appearing or diaphoretic.  HENT:     Head: Normocephalic and atraumatic.     Right Ear: Tympanic membrane, ear canal and external ear normal. There is no impacted cerumen.     Left Ear: Tympanic membrane, ear canal and external ear normal. There is no impacted cerumen.     Nose: Nose normal. No congestion or rhinorrhea.     Mouth/Throat:     Mouth: Mucous membranes are moist.     Pharynx: Oropharynx is clear. No oropharyngeal exudate or posterior oropharyngeal erythema.  Eyes:     General: No scleral icterus.       Right eye: No discharge.        Left eye: No discharge.     Extraocular Movements: Extraocular movements intact.     Conjunctiva/sclera: Conjunctivae normal.     Pupils: Pupils are equal, round, and reactive to light.   Neck:     Vascular: No carotid bruit.  Cardiovascular:     Rate and Rhythm: Normal rate and regular rhythm.     Pulses: Normal pulses.     Heart sounds: No murmur heard.    No friction rub.  No gallop.  Pulmonary:     Effort: Pulmonary effort is normal. No respiratory distress.     Breath sounds: Normal breath sounds. No stridor. No wheezing, rhonchi or rales.  Chest:     Chest wall: No tenderness.  Abdominal:     General: Abdomen is flat. Bowel sounds are normal. There is no distension.     Palpations: Abdomen is soft. There is no mass.     Tenderness: There is no abdominal tenderness. There is no right CVA tenderness, left CVA tenderness, guarding or rebound.     Hernia: No hernia is present.  Genitourinary:    Comments: Breast and pelvic exams deferred with shared decision making Musculoskeletal:        General: No swelling, tenderness, deformity or signs of injury.     Cervical back: Normal range of motion and neck supple. No rigidity. No muscular tenderness.     Right lower leg: No edema.     Left lower leg: No edema.  Lymphadenopathy:     Cervical: No cervical adenopathy.  Skin:    General: Skin is warm and dry.     Capillary Refill: Capillary refill takes less than 2 seconds.     Coloration: Skin is not jaundiced or pale.     Findings: No bruising, erythema, lesion or rash.  Neurological:     General: No focal deficit present.     Mental Status: She is alert and oriented to person, place, and time. Mental status is at baseline.     Cranial Nerves: No cranial nerve deficit.     Sensory: No sensory deficit.     Motor: No weakness.     Coordination: Coordination normal.     Gait: Gait normal.     Deep Tendon Reflexes: Reflexes normal.  Psychiatric:        Mood and Affect: Mood normal.        Behavior: Behavior normal.        Thought Content: Thought content normal.        Judgment: Judgment normal.     Results for orders placed or performed in visit on 03/16/23   Novel Coronavirus, NAA ([facility])   Specimen: Saline  Result Value Ref Range   SARS-CoV-2, NAA Not Detected Not Detected  Rapid Strep screen([facility])   Specimen: Other   Other  Result Value Ref Range   Strep Gp A Ag, IA W/Reflex Negative Negative  Culture, Group A Strep   Other  Result Value Ref Range   Strep A Culture Negative   Influenza A & B (STAT)  Result Value Ref Range   Influenza A Negative Negative   Influenza B Negative Negative      Assessment & Plan:   Problem List Items Addressed This Visit       Cardiovascular and Mediastinum   Perimenopausal vasomotor symptoms    No significant improvement on paxil, but tolerating it well. Continue current regimen. Continue to monitor. Recheck 6 months.       Relevant Medications   PARoxetine (PAXIL) 10 MG tablet   Other Visit Diagnoses     Routine general medical examination at a health care facility    -  Primary   Vaccines up to date/declined. Screening labs checked today. Pap, mammo and colonoscopy up to date. Continue diet and exercise. Call wiht any concerns.   Relevant Orders   CBC with Differential/Platelet   Comprehensive metabolic panel   Lipid Panel w/o Chol/HDL Ratio   TSH  Chronic fatigue       Continue paxil. Will check labs. Call with any concerns.   Relevant Orders   VITAMIN D 25 Hydroxy (Vit-D Deficiency, Fractures)   B12   Encounter for screening mammogram for malignant neoplasm of breast       Mammogram ordered today.   Relevant Orders   MM 3D SCREENING MAMMOGRAM BILATERAL BREAST   Herpes simplex       Relevant Medications   valACYclovir (VALTREX) 500 MG tablet        Follow up plan: Return in about 6 months (around 10/01/2023).   LABORATORY TESTING:  - Pap smear: up to date  IMMUNIZATIONS:   - Tdap: Tetanus vaccination status reviewed: last tetanus booster within 10 years. - Influenza: Refused - Pneumovax: Not applicable - Prevnar: Not applicable - COVID: Refused - HPV:  Not applicable - Shingrix vaccine: Refused  SCREENING: -Mammogram: Ordered today  - Colonoscopy: Up to date   PATIENT COUNSELING:   Advised to take 1 mg of folate supplement per day if capable of pregnancy.   Sexuality: Discussed sexually transmitted diseases, partner selection, use of condoms, avoidance of unintended pregnancy  and contraceptive alternatives.   Advised to avoid cigarette smoking.  I discussed with the patient that most people either abstain from alcohol or drink within safe limits (<=14/week and <=4 drinks/occasion for males, <=7/weeks and <= 3 drinks/occasion for females) and that the risk for alcohol disorders and other health effects rises proportionally with the number of drinks per week and how often a drinker exceeds daily limits.  Discussed cessation/primary prevention of drug use and availability of treatment for abuse.   Diet: Encouraged to adjust caloric intake to maintain  or achieve ideal body weight, to reduce intake of dietary saturated fat and total fat, to limit sodium intake by avoiding high sodium foods and not adding table salt, and to maintain adequate dietary potassium and calcium preferably from fresh fruits, vegetables, and low-fat dairy products.    stressed the importance of regular exercise  Injury prevention: Discussed safety belts, safety helmets, smoke detector, smoking near bedding or upholstery.   Dental health: Discussed importance of regular tooth brushing, flossing, and dental visits.    NEXT PREVENTATIVE PHYSICAL DUE IN 1 YEAR. Return in about 6 months (around 10/01/2023).

## 2023-04-03 NOTE — Patient Instructions (Signed)
Please call to schedule your mammogram and/or bone density: [facility]  Address: [address], [place], [place] [address] Phone: [phone]  [facility] [place] [address] [place],  [place]  [address] Phone: [phone]   

## 2023-04-03 NOTE — Assessment & Plan Note (Signed)
No significant improvement on paxil, but tolerating it well. Continue current regimen. Continue to monitor. Recheck 6 months.

## 2023-04-04 LAB — CBC WITH DIFFERENTIAL/PLATELET
Basophils Absolute: 0 10*3/uL (ref 0.0–0.2)
Basos: 1 %
EOS (ABSOLUTE): 0 10*3/uL (ref 0.0–0.4)
Eos: 1 %
Hematocrit: 42 % (ref 34.0–46.6)
Hemoglobin: 13.8 g/dL (ref 11.1–15.9)
Immature Grans (Abs): 0 10*3/uL (ref 0.0–0.1)
Immature Granulocytes: 0 %
Lymphocytes Absolute: 1.5 10*3/uL (ref 0.7–3.1)
Lymphs: 33 %
MCH: 28.7 pg (ref 26.6–33.0)
MCHC: 32.9 g/dL (ref 31.5–35.7)
MCV: 87 fL (ref 79–97)
Monocytes Absolute: 0.3 10*3/uL (ref 0.1–0.9)
Monocytes: 6 %
Neutrophils Absolute: 2.8 10*3/uL (ref 1.4–7.0)
Neutrophils: 59 %
Platelets: 227 10*3/uL (ref 150–450)
RBC: 4.81 x10E6/uL (ref 3.77–5.28)
RDW: 12.1 % (ref 11.7–15.4)
WBC: 4.7 10*3/uL (ref 3.4–10.8)

## 2023-04-04 LAB — VITAMIN D 25 HYDROXY (VIT D DEFICIENCY, FRACTURES): Vit D, 25-Hydroxy: 28.5 ng/mL — ABNORMAL LOW (ref 30.0–100.0)

## 2023-04-04 LAB — COMPREHENSIVE METABOLIC PANEL
ALT: 15 [IU]/L (ref 0–32)
AST: 14 [IU]/L (ref 0–40)
Albumin: 4.3 g/dL (ref 3.8–4.9)
Alkaline Phosphatase: 83 [IU]/L (ref 44–121)
BUN/Creatinine Ratio: 15 (ref 9–23)
BUN: 13 mg/dL (ref 6–24)
Bilirubin Total: 0.3 mg/dL (ref 0.0–1.2)
CO2: 25 mmol/L (ref 20–29)
Calcium: 9.2 mg/dL (ref 8.7–10.2)
Chloride: 105 mmol/L (ref 96–106)
Creatinine, Ser: 0.85 mg/dL (ref 0.57–1.00)
Globulin, Total: 1.7 g/dL (ref 1.5–4.5)
Glucose: 102 mg/dL — ABNORMAL HIGH (ref 70–99)
Potassium: 4.1 mmol/L (ref 3.5–5.2)
Sodium: 142 mmol/L (ref 134–144)
Total Protein: 6 g/dL (ref 6.0–8.5)
eGFR: 81 mL/min/{1.73_m2} (ref >59)

## 2023-04-04 LAB — TSH: TSH: 2.07 u[IU]/mL (ref 0.450–4.500)

## 2023-04-04 LAB — LIPID PANEL W/O CHOL/HDL RATIO
Cholesterol, Total: 157 mg/dL (ref 100–199)
HDL: 62 mg/dL (ref >39)
LDL Chol Calc (NIH): 81 mg/dL (ref 0–99)
Triglycerides: 73 mg/dL (ref 0–149)
VLDL Cholesterol Cal: 14 mg/dL (ref 5–40)

## 2023-04-04 LAB — VITAMIN B12: Vitamin B-12: 394 pg/mL (ref 232–1245)

## 2023-09-08 ENCOUNTER — Ambulatory Visit
Admission: RE | Admit: 2023-09-08 | Discharge: 2023-09-08 | Disposition: A | Discharge status: Home / Self Care | Payer: Self-pay | Source: Ambulatory Visit | Attending: Family Medicine | Admitting: Family Medicine

## 2023-09-08 DIAGNOSIS — Z1231 Encounter for screening mammogram for malignant neoplasm of breast: Secondary | ICD-10-CM | POA: Diagnosis present

## 2023-09-14 ENCOUNTER — Encounter: Payer: Self-pay | Admitting: Family Medicine

## 2023-10-02 ENCOUNTER — Encounter: Payer: Self-pay | Admitting: Family Medicine

## 2023-10-02 ENCOUNTER — Ambulatory Visit: Payer: Self-pay | Admitting: Family Medicine

## 2023-10-02 VITALS — BP 129/77 | HR 59 | Ht 63.0 in | Wt 152.0 lb

## 2023-10-02 DIAGNOSIS — R5382 Chronic fatigue, unspecified: Secondary | ICD-10-CM | POA: Diagnosis not present

## 2023-10-02 DIAGNOSIS — E559 Vitamin D deficiency, unspecified: Secondary | ICD-10-CM | POA: Diagnosis not present

## 2023-10-02 DIAGNOSIS — N951 Menopausal and female climacteric states: Secondary | ICD-10-CM

## 2023-10-02 MED ORDER — PAROXETINE HCL 10 MG PO TABS: 10.0000 mg | ORAL_TABLET | Freq: Every day | ORAL | 1 refills | Status: DC

## 2023-10-02 NOTE — Progress Notes (Signed)
 BP 129/77 (BP Location: Left Arm, Patient Position: Sitting, Cuff Size: Normal)   Pulse (!) 59   Ht 5\' 3"  (1.6 m)   Wt 152 lb (68.9 kg)   SpO2 99%   BMI 26.93 kg/m    Subjective:    Patient ID: Cheryl Dawson, female    DOB: 08-11-1965, 58 y.o.   MRN: [id number]  HPI: Cheryl Dawson is a 58 y.o. female  Chief Complaint  Patient presents with   Fatigue    Unable to get Paxil  for last 2 months    FATIGUE Duration:  months- possibly longer than when she was off the paxil  Severity: moderate  Onset: gradual Context when symptoms started:  unknown, sleep deprivation when taking care of the baby Symptoms improve with rest: yes  Depressive symptoms: no Stress/anxiety: yes Insomnia: yes- 1x a night  Snoring: no Observed apnea by bed partner: no Daytime hypersomnolence:no Wakes feeling refreshed: no History of sleep study: no Dysnea on exertion:  no Orthopnea/PND: no Chest pain: no Chronic cough: no Lower extremity edema: no Arthralgias:no Myalgias: no Weakness: no Rash: no  Relevant past medical, surgical, family and social history reviewed and updated as indicated. Interim medical history since our last visit reviewed. Allergies and medications reviewed and updated.  Review of Systems  Constitutional:  Positive for fatigue. Negative for activity change, appetite change, chills, diaphoresis, fever and unexpected weight change.  Respiratory: Negative.    Cardiovascular: Negative.   Musculoskeletal: Negative.   Psychiatric/Behavioral: Negative.      Per HPI unless specifically indicated above     Objective:     BP 129/77 (BP Location: Left Arm, Patient Position: Sitting, Cuff Size: Normal)   Pulse (!) 59   Ht 5\' 3"  (1.6 m)   Wt 152 lb (68.9 kg)   SpO2 99%   BMI 26.93 kg/m   Wt Readings from Last 3 Encounters:  10/02/23 152 lb (68.9 kg)  04/03/23 149 lb (67.6 kg)  03/16/23 150 lb 3.2 oz (68.1 kg)    Physical Exam Vitals and nursing note reviewed.   Constitutional:      General: She is not in acute distress.    Appearance: Normal appearance. She is normal weight. She is not ill-appearing, toxic-appearing or diaphoretic.  HENT:     Head: Normocephalic and atraumatic.     Right Ear: External ear normal.     Left Ear: External ear normal.     Nose: Nose normal.     Mouth/Throat:     Mouth: Mucous membranes are moist.     Pharynx: Oropharynx is clear.  Eyes:     General: No scleral icterus.       Right eye: No discharge.        Left eye: No discharge.     Extraocular Movements: Extraocular movements intact.     Conjunctiva/sclera: Conjunctivae normal.     Pupils: Pupils are equal, round, and reactive to light.  Cardiovascular:     Rate and Rhythm: Normal rate and regular rhythm.     Pulses: Normal pulses.     Heart sounds: Normal heart sounds. No murmur heard.    No friction rub. No gallop.  Pulmonary:     Effort: Pulmonary effort is normal. No respiratory distress.     Breath sounds: Normal breath sounds. No stridor. No wheezing, rhonchi or rales.  Chest:     Chest wall: No tenderness.  Musculoskeletal:        General: Normal range of motion.  Cervical back: Normal range of motion and neck supple.  Skin:    General: Skin is warm and dry.     Capillary Refill: Capillary refill takes less than 2 seconds.     Coloration: Skin is not jaundiced or pale.     Findings: No bruising, erythema, lesion or rash.  Neurological:     General: No focal deficit present.     Mental Status: She is alert and oriented to person, place, and time. Mental status is at baseline.  Psychiatric:        Mood and Affect: Mood normal.        Behavior: Behavior normal.        Thought Content: Thought content normal.        Judgment: Judgment normal.     Results for orders placed or performed in visit on 04/03/23  CBC with Differential/Platelet   Collection Time: 04/03/23 10:54 AM  Result Value Ref Range   WBC 4.7 3.4 - 10.8 x10E3/uL   RBC  4.81 3.77 - 5.28 x10E6/uL   Hemoglobin 13.8 11.1 - 15.9 g/dL   Hematocrit 16.1 09.6 - 46.6 %   MCV 87 79 - 97 fL   MCH 28.7 26.6 - 33.0 pg   MCHC 32.9 31.5 - 35.7 g/dL   RDW 04.5 40.9 - 81.1 %   Platelets 227 150 - 450 x10E3/uL   Neutrophils 59 Not Estab. %   Lymphs 33 Not Estab. %   Monocytes 6 Not Estab. %   Eos 1 Not Estab. %   Basos 1 Not Estab. %   Neutrophils Absolute 2.8 1.4 - 7.0 x10E3/uL   Lymphocytes Absolute 1.5 0.7 - 3.1 x10E3/uL   Monocytes Absolute 0.3 0.1 - 0.9 x10E3/uL   EOS (ABSOLUTE) 0.0 0.0 - 0.4 x10E3/uL   Basophils Absolute 0.0 0.0 - 0.2 x10E3/uL   Immature Granulocytes 0 Not Estab. %   Immature Grans (Abs) 0.0 0.0 - 0.1 x10E3/uL  Comprehensive metabolic panel   Collection Time: 04/03/23 10:54 AM  Result Value Ref Range   Glucose 102 (H) 70 - 99 mg/dL   BUN 13 6 - 24 mg/dL   Creatinine, Ser 9.14 0.57 - 1.00 mg/dL   eGFR 81 >78 GN/FAO/1.30   BUN/Creatinine Ratio 15 9 - 23   Sodium 142 134 - 144 mmol/L   Potassium 4.1 3.5 - 5.2 mmol/L   Chloride 105 96 - 106 mmol/L   CO2 25 20 - 29 mmol/L   Calcium 9.2 8.7 - 10.2 mg/dL   Total Protein 6.0 6.0 - 8.5 g/dL   Albumin 4.3 3.8 - 4.9 g/dL   Globulin, Total 1.7 1.5 - 4.5 g/dL   Bilirubin Total 0.3 0.0 - 1.2 mg/dL   Alkaline Phosphatase 83 44 - 121 IU/L   AST 14 0 - 40 IU/L   ALT 15 0 - 32 IU/L  Lipid Panel w/o Chol/HDL Ratio   Collection Time: 04/03/23 10:54 AM  Result Value Ref Range   Cholesterol, Total 157 100 - 199 mg/dL   Triglycerides 73 0 - 149 mg/dL   HDL 62 >86 mg/dL   VLDL Cholesterol Cal 14 5 - 40 mg/dL   LDL Chol Calc (NIH) 81 0 - 99 mg/dL  TSH   Collection Time: 04/03/23 10:54 AM  Result Value Ref Range   TSH 2.070 0.450 - 4.500 uIU/mL  VITAMIN D  25 Hydroxy (Vit-D Deficiency, Fractures)   Collection Time: 04/03/23 10:54 AM  Result Value Ref Range   Vit D, 25-Hydroxy 28.5 (  L) 30.0 - 100.0 ng/mL  B12   Collection Time: 04/03/23 10:54 AM  Result Value Ref Range   Vitamin B-12 394 232 -  1,245 pg/mL      Assessment & Plan:   Problem List Items Addressed This Visit       Cardiovascular and Mediastinum   Perimenopausal vasomotor symptoms   Relevant Medications   PARoxetine  (PAXIL ) 10 MG tablet   Other Visit Diagnoses       Chronic fatigue    -  Primary   Will restart her on her paxil  and obtain sleep study. Await results. Recheck 6 weeks.   Relevant Orders   Ambulatory referral to Sleep Studies   B12     Vitamin D  deficiency       Rechecking labs today. Await results. Treat as needed.   Relevant Orders   VITAMIN D  25 Hydroxy (Vit-D Deficiency, Fractures)        Follow up plan: Return in about 6 weeks (around 11/13/2023).

## 2023-10-03 ENCOUNTER — Ambulatory Visit: Payer: Self-pay | Admitting: Family Medicine

## 2023-10-03 LAB — VITAMIN D 25 HYDROXY (VIT D DEFICIENCY, FRACTURES): Vit D, 25-Hydroxy: 26.4 ng/mL — ABNORMAL LOW (ref 30.0–100.0)

## 2023-10-03 LAB — VITAMIN B12: Vitamin B-12: 427 pg/mL (ref 232–1245)

## 2023-10-13 ENCOUNTER — Other Ambulatory Visit: Payer: Self-pay | Admitting: Family Medicine

## 2023-10-13 DIAGNOSIS — N951 Menopausal and female climacteric states: Secondary | ICD-10-CM

## 2023-10-13 NOTE — Telephone Encounter (Unsigned)
 Copied from CRM [id number]. Topic: Clinical - Medication Refill >> Oct 13, 2023 12:52 PM Santiya F wrote: Medication: PARoxetine  (PAXIL ) 10 MG tablet [[id number]], valACYclovir  (VALTREX ) 500 MG tablet [[id number]]  Has the patient contacted their pharmacy? Yes  (Agent: If yes, when and what did the pharmacy advise?) Contact office   This is the patient's preferred pharmacy:  [facility] [address], [place] - [address] [place] [address] Phone: [phone] Fax: [phone]  Is this the correct pharmacy for this prescription? Yes If no, delete pharmacy and type the correct one.   Has the prescription been filled recently? Yes  Is the patient out of the medication? Yes  Has the patient been seen for an appointment in the last year OR does the patient have an upcoming appointment? Yes  Can we respond through MyChart? Yes  Agent: Please be advised that Rx refills may take up to 3 business days. We ask that you follow-up with your pharmacy.

## 2023-10-17 NOTE — Telephone Encounter (Signed)
 Too soon for refill, refilled 10/02/23.  Requested Prescriptions  Pending Prescriptions Disp Refills   PARoxetine  (PAXIL ) 10 MG tablet [Pharmacy Med Name: PAROXETINE  HCL 10 MG TABLET] 90 tablet 1    Sig: TAKE 1 TABLET BY MOUTH EVERY DAY     Psychiatry:  Antidepressants - SSRI Passed - 10/17/2023  1:58 PM      Passed - Valid encounter within last 6 months    Recent Outpatient Visits           2 weeks ago Chronic fatigue   [facility] [place], [place], DO

## 2023-11-17 ENCOUNTER — Ambulatory Visit: Admitting: Family Medicine

## 2024-01-05 ENCOUNTER — Ambulatory Visit: Admitting: Family Medicine

## 2024-01-05 ENCOUNTER — Encounter: Payer: Self-pay | Admitting: Family Medicine

## 2024-01-05 VITALS — BP 126/85 | HR 65 | Temp 98.1°F | Ht 63.0 in | Wt 153.4 lb

## 2024-01-05 DIAGNOSIS — B009 Herpesviral infection, unspecified: Secondary | ICD-10-CM | POA: Diagnosis not present

## 2024-01-05 DIAGNOSIS — R5382 Chronic fatigue, unspecified: Secondary | ICD-10-CM | POA: Diagnosis not present

## 2024-01-05 MED ORDER — VALACYCLOVIR HCL 500 MG PO TABS: ORAL_TABLET | ORAL | 11 refills | Status: DC

## 2024-01-05 NOTE — Progress Notes (Signed)
 BP 126/85   Pulse 65   Temp 98.1 F (36.7 C) (Oral)   Ht 5' 3 (1.6 m)   Wt 153 lb 6.4 oz (69.6 kg)   SpO2 97%   BMI 27.17 kg/m    Subjective:    Patient ID: Cheryl Dawson, female    DOB: Aug 09, 1965, 58 y.o.   MRN: [id number]  HPI: Cheryl Dawson is a 58 y.o. female  Chief Complaint  Patient presents with   Fatigue   FATIGUE Duration:  chronic, but much better since getting back on the paxil  Severity: mild  Onset: gradual Context when symptoms started:  unknown Symptoms improve with rest: yes  Depressive symptoms: no Stress/anxiety: no Insomnia: no  Snoring: no Observed apnea by bed partner: no Daytime hypersomnolence:no Wakes feeling refreshed: no History of sleep study: no Dysnea on exertion:  no Orthopnea/PND: no Chest pain: no Chronic cough: no Lower extremity edema: no Arthralgias:no Myalgias: no Weakness: no Rash: no  Relevant past medical, surgical, family and social history reviewed and updated as indicated. Interim medical history since our last visit reviewed. Allergies and medications reviewed and updated.  Review of Systems  Constitutional: Negative.   Respiratory: Negative.    Cardiovascular: Negative.   Gastrointestinal: Negative.   Musculoskeletal: Negative.   Psychiatric/Behavioral: Negative.      Per HPI unless specifically indicated above     Objective:    BP 126/85   Pulse 65   Temp 98.1 F (36.7 C) (Oral)   Ht 5' 3 (1.6 m)   Wt 153 lb 6.4 oz (69.6 kg)   SpO2 97%   BMI 27.17 kg/m   Wt Readings from Last 3 Encounters:  01/05/24 153 lb 6.4 oz (69.6 kg)  10/02/23 152 lb (68.9 kg)  04/03/23 149 lb (67.6 kg)    Physical Exam Vitals and nursing note reviewed.  Constitutional:      General: She is not in acute distress.    Appearance: Normal appearance. She is not ill-appearing, toxic-appearing or diaphoretic.  HENT:     Head: Normocephalic and atraumatic.     Right Ear: External ear normal.     Left Ear: External ear  normal.     Nose: Nose normal.     Mouth/Throat:     Mouth: Mucous membranes are moist.     Pharynx: Oropharynx is clear.  Eyes:     General: No scleral icterus.       Right eye: No discharge.        Left eye: No discharge.     Extraocular Movements: Extraocular movements intact.     Conjunctiva/sclera: Conjunctivae normal.     Pupils: Pupils are equal, round, and reactive to light.  Cardiovascular:     Rate and Rhythm: Normal rate and regular rhythm.     Pulses: Normal pulses.     Heart sounds: Normal heart sounds. No murmur heard.    No friction rub. No gallop.  Pulmonary:     Effort: Pulmonary effort is normal. No respiratory distress.     Breath sounds: Normal breath sounds. No stridor. No wheezing, rhonchi or rales.  Chest:     Chest wall: No tenderness.  Musculoskeletal:        General: Normal range of motion.     Cervical back: Normal range of motion and neck supple.  Skin:    General: Skin is warm and dry.     Capillary Refill: Capillary refill takes less than 2 seconds.  Coloration: Skin is not jaundiced or pale.     Findings: No bruising, erythema, lesion or rash.  Neurological:     General: No focal deficit present.     Mental Status: She is alert and oriented to person, place, and time. Mental status is at baseline.  Psychiatric:        Mood and Affect: Mood normal.        Behavior: Behavior normal.        Thought Content: Thought content normal.        Judgment: Judgment normal.     Results for orders placed or performed in visit on 10/02/23  VITAMIN D  25 Hydroxy (Vit-D Deficiency, Fractures)   Collection Time: 10/02/23  3:57 PM  Result Value Ref Range   Vit D, 25-Hydroxy 26.4 (L) 30.0 - 100.0 ng/mL  B12   Collection Time: 10/02/23  3:57 PM  Result Value Ref Range   Vitamin B-12 427 232 - 1,245 pg/mL      Assessment & Plan:   Problem List Items Addressed This Visit   None Visit Diagnoses       Chronic fatigue    -  Primary   Feeling  significantly better. Continue current regimen. Continue to monitor. Call with any concerns.     Herpes simplex       Relevant Medications   valACYclovir  (VALTREX ) 500 MG tablet        Follow up plan: Return in about 3 months (around 04/06/2024).

## 2024-04-04 ENCOUNTER — Ambulatory Visit (INDEPENDENT_AMBULATORY_CARE_PROVIDER_SITE_OTHER): Admitting: Family Medicine

## 2024-04-04 ENCOUNTER — Encounter: Payer: Self-pay | Admitting: Family Medicine

## 2024-04-04 VITALS — BP 124/80 | HR 59 | Temp 97.9°F | Ht 63.0 in | Wt 154.0 lb

## 2024-04-04 DIAGNOSIS — Z1231 Encounter for screening mammogram for malignant neoplasm of breast: Secondary | ICD-10-CM | POA: Diagnosis not present

## 2024-04-04 DIAGNOSIS — B009 Herpesviral infection, unspecified: Secondary | ICD-10-CM | POA: Diagnosis not present

## 2024-04-04 DIAGNOSIS — N951 Menopausal and female climacteric states: Secondary | ICD-10-CM

## 2024-04-04 DIAGNOSIS — Z23 Encounter for immunization: Secondary | ICD-10-CM | POA: Diagnosis not present

## 2024-04-04 DIAGNOSIS — E559 Vitamin D deficiency, unspecified: Secondary | ICD-10-CM | POA: Diagnosis not present

## 2024-04-04 DIAGNOSIS — Z0000 Encounter for general adult medical examination without abnormal findings: Secondary | ICD-10-CM | POA: Diagnosis not present

## 2024-04-04 MED ORDER — VALACYCLOVIR HCL 500 MG PO TABS: ORAL_TABLET | ORAL | 11 refills | Status: AC

## 2024-04-04 MED ORDER — OMEPRAZOLE 20 MG PO CPDR: 20.0000 mg | DELAYED_RELEASE_CAPSULE | Freq: Every day | ORAL | 1 refills | Status: DC

## 2024-04-04 MED ORDER — PAROXETINE HCL 10 MG PO TABS: 10.0000 mg | ORAL_TABLET | Freq: Every day | ORAL | 1 refills | Status: AC

## 2024-04-04 MED ORDER — SUMATRIPTAN SUCCINATE 100 MG PO TABS: 100.0000 mg | ORAL_TABLET | ORAL | 11 refills | Status: AC | PRN

## 2024-04-04 NOTE — Progress Notes (Signed)
 BP 124/80   Pulse (!) 59   Temp 97.9 F (36.6 C) (Oral)   Ht 5' 3 (1.6 m)   Wt 154 lb (69.9 kg)   SpO2 98%   BMI 27.28 kg/m    Subjective:    Patient ID: Cheryl Dawson, female    DOB: 08-31-1965, 58 y.o.   MRN: [id number]  HPI: Cheryl Dawson is a 58 y.o. female presenting on 04/04/2024 for comprehensive medical examination. Current medical complaints include:  VASOMOTOR SYMPTOMS Duration: chronic Status:controlled Anxious mood: no  Excessive worrying: no Irritability: no  Sweating: no Nausea: no Palpitations:no Hyperventilation: no Panic attacks: no Agoraphobia: no  Obscessions/compulsions: no Depressed mood: no    04/03/2023   10:52 AM 01/04/2023    9:41 AM 04/01/2022   10:27 AM 06/04/2021    9:57 AM 04/15/2020    9:06 AM  Depression screen PHQ 2/9  Decreased Interest 0 1 1 0 0  Down, Depressed, Hopeless 0 1 1 0 0  PHQ - 2 Score 0 2 2 0 0  Altered sleeping  0 0 0   Tired, decreased energy  1 3 0   Change in appetite  0 1 0   Feeling bad or failure about yourself   0 0 0   Trouble concentrating  0 0 0   Moving slowly or fidgety/restless  0 1 0   Suicidal thoughts  0 0 0   PHQ-9 Score  3  7  0    Difficult doing work/chores  Not difficult at all Not difficult at all Not difficult at all      Data saved with a previous flowsheet row definition   Anhedonia: no Weight changes: no Insomnia: no   Hypersomnia: no Fatigue/loss of energy: no Feelings of worthlessness: no Feelings of guilt: no Impaired concentration/indecisiveness: no Suicidal ideations: no  Crying spells: no Recent Stressors/Life Changes: no   Relationship problems: no   Family stress: no     Financial stress: no    Job stress: no    Recent death/loss: no  Menopausal Symptoms: no- doing well on her medicine.   Depression Screen done today and results listed below:     04/03/2023   10:52 AM 01/04/2023    9:41 AM 04/01/2022   10:27 AM 06/04/2021    9:57 AM 04/15/2020    9:06 AM   Depression screen PHQ 2/9  Decreased Interest 0 1 1 0 0  Down, Depressed, Hopeless 0 1 1 0 0  PHQ - 2 Score 0 2 2 0 0  Altered sleeping  0 0 0   Tired, decreased energy  1 3 0   Change in appetite  0 1 0   Feeling bad or failure about yourself   0 0 0   Trouble concentrating  0 0 0   Moving slowly or fidgety/restless  0 1 0   Suicidal thoughts  0 0 0   PHQ-9 Score  3  7  0    Difficult doing work/chores  Not difficult at all Not difficult at all Not difficult at all      Data saved with a previous flowsheet row definition    Past Medical History:  Past Medical History:  Diagnosis Date   Melanoma (HCC) 06/09/2020   L scapula - [facility]    Migraine    Undiagnosed cardiac murmurs    Uterine fibroids affecting pregnancy     Surgical History:  Past Surgical History:  Procedure Laterality Date  ABDOMINAL HYSTERECTOMY     CESAREAN SECTION     x2   COLONOSCOPY WITH PROPOFOL  N/A 04/26/2019   Procedure: COLONOSCOPY WITH PROPOFOL ;  Surgeon: Janalyn Keene NOVAK, MD;  Location: [facility];  Service: Endoscopy;  Laterality: N/A;   DOPPLER ECHOCARDIOGRAPHY  2006   tubes reversed      Medications:  Current Outpatient Medications on File Prior to Visit  Medication Sig   clobetasol  ointment (TEMOVATE ) 0.05 % APPLY TO AFFECTED AREA TWICE A DAY   fexofenadine  (ALLEGRA  ALLERGY) 180 MG tablet Take 1 tablet (180 mg total) by mouth daily.   No current facility-administered medications on file prior to visit.    Allergies:  Allergies  Allergen Reactions   Penicillins Hives   Sulfa Antibiotics Rash    Social History:  Social History   Socioeconomic History   Marital status: Married    Spouse name: Not on file   Number of children: Not on file   Years of education: Not on file   Highest education level: Not on file  Occupational History   Not on file  Tobacco Use   Smoking status: Never   Smokeless tobacco: Never  Vaping Use   Vaping status: Never Used  Substance  and Sexual Activity   Alcohol use: Yes    Alcohol/week: 0.0 standard drinks of alcohol    Comment: rare   Drug use: No   Sexual activity: Not on file  Other Topics Concern   Not on file  Social History Narrative   Raising 56 yr old female grand daughter. Working full time.    Social Drivers of Corporate Investment Banker Strain: Low Risk  (04/04/2024)   Overall Financial Resource Strain (CARDIA)    Difficulty of Paying Living Expenses: Not hard at all  Food Insecurity: No Food Insecurity (04/04/2024)   Hunger Vital Sign    Worried About Running Out of Food in the Last Year: Never true    Ran Out of Food in the Last Year: Never true  Transportation Needs: No Transportation Needs (04/04/2024)   PRAPARE - Administrator, Civil Service (Medical): No    Lack of Transportation (Non-Medical): No  Physical Activity: Sufficiently Active (04/04/2024)   Exercise Vital Sign    Days of Exercise per Week: 5 days    Minutes of Exercise per Session: 60 min  Stress: No Stress Concern Present (04/04/2024)   [employer] of Occupational Health - Occupational Stress Questionnaire    Feeling of Stress: Not at all  Social Connections: Moderately Integrated (04/04/2024)   Social Connection and Isolation Panel    Frequency of Communication with Friends and Family: More than three times a week    Frequency of Social Gatherings with Friends and Family: Once a week    Attends Religious Services: More than 4 times per year    Active Member of [employer] or Organizations: No    Attends Banker Meetings: Never    Marital Status: Married  Catering Manager Violence: Not At Risk (04/04/2024)   Humiliation, Afraid, Rape, and Kick questionnaire    Fear of Current or Ex-Partner: No    Emotionally Abused: No    Physically Abused: No    Sexually Abused: No   Social History   Tobacco Use  Smoking Status Never  Smokeless Tobacco Never   Social History   Substance and Sexual  Activity  Alcohol Use Yes   Alcohol/week: 0.0 standard drinks of alcohol   Comment: rare  Family History:  Family History  Problem Relation Age of Onset   Aneurysm Mother    Diabetes Father    Heart disease Sister    Heart disease Brother    Stroke Brother    Breast cancer Maternal Aunt     Past medical history, surgical history, medications, allergies, family history and social history reviewed with patient today and changes made to appropriate areas of the chart.   Review of Systems  Constitutional: Negative.   HENT: Negative.    Eyes: Negative.   Respiratory: Negative.    Cardiovascular: Negative.   Gastrointestinal:  Positive for nausea. Negative for abdominal pain, blood in stool, constipation, diarrhea, heartburn, melena and vomiting.  Genitourinary: Negative.   Musculoskeletal: Negative.   Skin: Negative.   Neurological: Negative.   Endo/Heme/Allergies:  Positive for polydipsia. Negative for environmental allergies. Bruises/bleeds easily.  Psychiatric/Behavioral: Negative.     All other ROS negative except what is listed above and in the HPI.      Objective:    BP 124/80   Pulse (!) 59   Temp 97.9 F (36.6 C) (Oral)   Ht 5' 3 (1.6 m)   Wt 154 lb (69.9 kg)   SpO2 98%   BMI 27.28 kg/m   Wt Readings from Last 3 Encounters:  04/04/24 154 lb (69.9 kg)  01/05/24 153 lb 6.4 oz (69.6 kg)  10/02/23 152 lb (68.9 kg)    Physical Exam Vitals and nursing note reviewed.  Constitutional:      General: She is not in acute distress.    Appearance: Normal appearance. She is not ill-appearing, toxic-appearing or diaphoretic.  HENT:     Head: Normocephalic and atraumatic.     Right Ear: Tympanic membrane, ear canal and external ear normal. There is no impacted cerumen.     Left Ear: Tympanic membrane, ear canal and external ear normal. There is no impacted cerumen.     Nose: Nose normal. No congestion or rhinorrhea.     Mouth/Throat:     Mouth: Mucous membranes  are moist.     Pharynx: Oropharynx is clear. No oropharyngeal exudate or posterior oropharyngeal erythema.  Eyes:     General: No scleral icterus.       Right eye: No discharge.        Left eye: No discharge.     Extraocular Movements: Extraocular movements intact.     Conjunctiva/sclera: Conjunctivae normal.     Pupils: Pupils are equal, round, and reactive to light.  Neck:     Vascular: No carotid bruit.  Cardiovascular:     Rate and Rhythm: Normal rate and regular rhythm.     Pulses: Normal pulses.     Heart sounds: No murmur heard.    No friction rub. No gallop.  Pulmonary:     Effort: Pulmonary effort is normal. No respiratory distress.     Breath sounds: Normal breath sounds. No stridor. No wheezing, rhonchi or rales.  Chest:     Chest wall: No tenderness.  Abdominal:     General: Abdomen is flat. Bowel sounds are normal. There is no distension.     Palpations: Abdomen is soft. There is no mass.     Tenderness: There is no abdominal tenderness. There is no right CVA tenderness, left CVA tenderness, guarding or rebound.     Hernia: No hernia is present.  Genitourinary:    Comments: Breast and pelvic exams deferred with shared decision making Musculoskeletal:  General: No swelling, tenderness, deformity or signs of injury.     Cervical back: Normal range of motion and neck supple. No rigidity. No muscular tenderness.     Right lower leg: No edema.     Left lower leg: No edema.  Lymphadenopathy:     Cervical: No cervical adenopathy.  Skin:    General: Skin is warm and dry.     Capillary Refill: Capillary refill takes less than 2 seconds.     Coloration: Skin is not jaundiced or pale.     Findings: No bruising, erythema, lesion or rash.  Neurological:     General: No focal deficit present.     Mental Status: She is alert and oriented to person, place, and time. Mental status is at baseline.     Cranial Nerves: No cranial nerve deficit.     Sensory: No sensory  deficit.     Motor: No weakness.     Coordination: Coordination normal.     Gait: Gait normal.     Deep Tendon Reflexes: Reflexes normal.  Psychiatric:        Mood and Affect: Mood normal.        Behavior: Behavior normal.        Thought Content: Thought content normal.        Judgment: Judgment normal.     Results for orders placed or performed in visit on 04/04/24  CBC with Differential/Platelet   Collection Time: 04/04/24  3:03 PM  Result Value Ref Range   WBC 4.8 3.4 - 10.8 x10E3/uL   RBC 4.72 3.77 - 5.28 x10E6/uL   Hemoglobin 13.9 11.1 - 15.9 g/dL   Hematocrit 58.2 65.9 - 46.6 %   MCV 88 79 - 97 fL   MCH 29.4 26.6 - 33.0 pg   MCHC 33.3 31.5 - 35.7 g/dL   RDW 87.3 88.2 - 84.5 %   Platelets 232 150 - 450 x10E3/uL   Neutrophils 46 Not Estab. %   Lymphs 44 Not Estab. %   Monocytes 8 Not Estab. %   Eos 1 Not Estab. %   Basos 1 Not Estab. %   Neutrophils Absolute 2.2 1.4 - 7.0 x10E3/uL   Lymphocytes Absolute 2.1 0.7 - 3.1 x10E3/uL   Monocytes Absolute 0.4 0.1 - 0.9 x10E3/uL   EOS (ABSOLUTE) 0.1 0.0 - 0.4 x10E3/uL   Basophils Absolute 0.0 0.0 - 0.2 x10E3/uL   Immature Granulocytes 0 Not Estab. %   Immature Grans (Abs) 0.0 0.0 - 0.1 x10E3/uL  Comprehensive metabolic panel with GFR   Collection Time: 04/04/24  3:03 PM  Result Value Ref Range   Glucose 84 70 - 99 mg/dL   BUN 19 6 - 24 mg/dL   Creatinine, Ser 9.06 0.57 - 1.00 mg/dL   eGFR 72 >40 fO/fpw/8.26   BUN/Creatinine Ratio 20 9 - 23   Sodium 141 134 - 144 mmol/L   Potassium 4.5 3.5 - 5.2 mmol/L   Chloride 104 96 - 106 mmol/L   CO2 26 20 - 29 mmol/L   Calcium 9.7 8.7 - 10.2 mg/dL   Total Protein 6.5 6.0 - 8.5 g/dL   Albumin 4.5 3.8 - 4.9 g/dL   Globulin, Total 2.0 1.5 - 4.5 g/dL   Bilirubin Total 0.3 0.0 - 1.2 mg/dL   Alkaline Phosphatase 88 49 - 135 IU/L   AST 13 0 - 40 IU/L   ALT 11 0 - 32 IU/L  Lipid Panel w/o Chol/HDL Ratio   Collection Time: 04/04/24  3:03  PM  Result Value Ref Range   Cholesterol,  Total 151 100 - 199 mg/dL   Triglycerides 63 0 - 149 mg/dL   HDL 62 >60 mg/dL   VLDL Cholesterol Cal 13 5 - 40 mg/dL   LDL Chol Calc (NIH) 76 0 - 99 mg/dL  TSH   Collection Time: 04/04/24  3:03 PM  Result Value Ref Range   TSH 2.170 0.450 - 4.500 uIU/mL  Hepatitis B surface antibody,quantitative   Collection Time: 04/04/24  3:03 PM  Result Value Ref Range   Hepatitis B Surf Ab Quant <3.5 (L) Immunity>10 mIU/mL  VITAMIN D  25 Hydroxy (Vit-D Deficiency, Fractures)   Collection Time: 04/04/24  3:03 PM  Result Value Ref Range   Vit D, 25-Hydroxy 44.6 30.0 - 100.0 ng/mL      Assessment & Plan:   Problem List Items Addressed This Visit       Cardiovascular and Mediastinum   Perimenopausal vasomotor symptoms   Under good control on current regimen. Continue current regimen. Continue to monitor. Call with any concerns. Refills given.        Relevant Medications   PARoxetine  (PAXIL ) 10 MG tablet     Other   Vitamin D  deficiency   Rechecking labs today. Await results. Treat as needed.       Relevant Orders   VITAMIN D  25 Hydroxy (Vit-D Deficiency, Fractures) (Completed)   Other Visit Diagnoses       Routine general medical examination at a health care facility    -  Primary   Vaccines up to date/declined. Screening labs checked today. Pap N/A. Mammo ordered. Colonoscopy up to date. Continue diet and exercise. Call with concerns.   Relevant Orders   CBC with Differential/Platelet (Completed)   Comprehensive metabolic panel with GFR (Completed)   Lipid Panel w/o Chol/HDL Ratio (Completed)   TSH (Completed)   Hepatitis B surface antibody,quantitative (Completed)     Herpes simplex       Refills given today. Call with any concerns.   Relevant Medications   valACYclovir  (VALTREX ) 500 MG tablet     Encounter for screening mammogram for malignant neoplasm of breast       Mammogram ordered today.   Relevant Orders   MM 3D SCREENING MAMMOGRAM BILATERAL BREAST        Follow  up plan: Return in about 6 months (around 10/02/2024).   LABORATORY TESTING:  - Pap smear: not applicable  IMMUNIZATIONS:   - Tdap: Tetanus vaccination status reviewed: last tetanus booster within 10 years. - Influenza: Up to date - Pneumovax: Refused - Prevnar: Refused - COVID: Refused - HPV: Not applicable - Shingrix vaccine: Refused  SCREENING: -Mammogram: Ordered today  - Colonoscopy: Up to date    PATIENT COUNSELING:   Advised to take 1 mg of folate supplement per day if capable of pregnancy.   Sexuality: Discussed sexually transmitted diseases, partner selection, use of condoms, avoidance of unintended pregnancy  and contraceptive alternatives.   Advised to avoid cigarette smoking.  I discussed with the patient that most people either abstain from alcohol or drink within safe limits (<=14/week and <=4 drinks/occasion for males, <=7/weeks and <= 3 drinks/occasion for females) and that the risk for alcohol disorders and other health effects rises proportionally with the number of drinks per week and how often a drinker exceeds daily limits.  Discussed cessation/primary prevention of drug use and availability of treatment for abuse.   Diet: Encouraged to adjust caloric intake to maintain  or achieve  ideal body weight, to reduce intake of dietary saturated fat and total fat, to limit sodium intake by avoiding high sodium foods and not adding table salt, and to maintain adequate dietary potassium and calcium preferably from fresh fruits, vegetables, and low-fat dairy products.    stressed the importance of regular exercise  Injury prevention: Discussed safety belts, safety helmets, smoke detector, smoking near bedding or upholstery.   Dental health: Discussed importance of regular tooth brushing, flossing, and dental visits.    NEXT PREVENTATIVE PHYSICAL DUE IN 1 YEAR. Return in about 6 months (around 10/02/2024).

## 2024-04-05 LAB — VITAMIN D 25 HYDROXY (VIT D DEFICIENCY, FRACTURES): Vit D, 25-Hydroxy: 44.6 ng/mL (ref 30.0–100.0)

## 2024-04-05 LAB — CBC WITH DIFFERENTIAL/PLATELET
Basophils Absolute: 0 x10E3/uL (ref 0.0–0.2)
Basos: 1 %
EOS (ABSOLUTE): 0.1 x10E3/uL (ref 0.0–0.4)
Eos: 1 %
Hematocrit: 41.7 % (ref 34.0–46.6)
Hemoglobin: 13.9 g/dL (ref 11.1–15.9)
Immature Grans (Abs): 0 x10E3/uL (ref 0.0–0.1)
Immature Granulocytes: 0 %
Lymphocytes Absolute: 2.1 x10E3/uL (ref 0.7–3.1)
Lymphs: 44 %
MCH: 29.4 pg (ref 26.6–33.0)
MCHC: 33.3 g/dL (ref 31.5–35.7)
MCV: 88 fL (ref 79–97)
Monocytes Absolute: 0.4 x10E3/uL (ref 0.1–0.9)
Monocytes: 8 %
Neutrophils Absolute: 2.2 x10E3/uL (ref 1.4–7.0)
Neutrophils: 46 %
Platelets: 232 x10E3/uL (ref 150–450)
RBC: 4.72 x10E6/uL (ref 3.77–5.28)
RDW: 12.6 % (ref 11.7–15.4)
WBC: 4.8 x10E3/uL (ref 3.4–10.8)

## 2024-04-05 LAB — COMPREHENSIVE METABOLIC PANEL WITH GFR
ALT: 11 IU/L (ref 0–32)
AST: 13 IU/L (ref 0–40)
Albumin: 4.5 g/dL (ref 3.8–4.9)
Alkaline Phosphatase: 88 IU/L (ref 49–135)
BUN/Creatinine Ratio: 20 (ref 9–23)
BUN: 19 mg/dL (ref 6–24)
Bilirubin Total: 0.3 mg/dL (ref 0.0–1.2)
CO2: 26 mmol/L (ref 20–29)
Calcium: 9.7 mg/dL (ref 8.7–10.2)
Chloride: 104 mmol/L (ref 96–106)
Creatinine, Ser: 0.93 mg/dL (ref 0.57–1.00)
Globulin, Total: 2 g/dL (ref 1.5–4.5)
Glucose: 84 mg/dL (ref 70–99)
Potassium: 4.5 mmol/L (ref 3.5–5.2)
Sodium: 141 mmol/L (ref 134–144)
Total Protein: 6.5 g/dL (ref 6.0–8.5)
eGFR: 72 mL/min/1.73 (ref >59)

## 2024-04-05 LAB — LIPID PANEL W/O CHOL/HDL RATIO
Cholesterol, Total: 151 mg/dL (ref 100–199)
HDL: 62 mg/dL (ref >39)
LDL Chol Calc (NIH): 76 mg/dL (ref 0–99)
Triglycerides: 63 mg/dL (ref 0–149)
VLDL Cholesterol Cal: 13 mg/dL (ref 5–40)

## 2024-04-05 LAB — TSH: TSH: 2.17 u[IU]/mL (ref 0.450–4.500)

## 2024-04-05 LAB — HEPATITIS B SURFACE ANTIBODY, QUANTITATIVE: Hepatitis B Surf Ab Quant: <3.5 m[IU]/mL — ABNORMAL LOW

## 2024-04-07 ENCOUNTER — Ambulatory Visit: Payer: Self-pay | Admitting: Family Medicine

## 2024-04-07 ENCOUNTER — Encounter: Payer: Self-pay | Admitting: Family Medicine

## 2024-04-07 DIAGNOSIS — E559 Vitamin D deficiency, unspecified: Secondary | ICD-10-CM | POA: Insufficient documentation

## 2024-04-07 NOTE — Assessment & Plan Note (Signed)
 Under good control on current regimen. Continue current regimen. Continue to monitor. Call with any concerns. Refills given.

## 2024-04-07 NOTE — Assessment & Plan Note (Signed)
 Rechecking labs today. Await results. Treat as needed.

## 2024-04-08 ENCOUNTER — Encounter: Payer: Self-pay | Admitting: Family Medicine

## 2024-04-18 ENCOUNTER — Other Ambulatory Visit: Payer: Self-pay | Admitting: Family Medicine

## 2024-04-18 DIAGNOSIS — N951 Menopausal and female climacteric states: Secondary | ICD-10-CM

## 2024-04-23 NOTE — Telephone Encounter (Signed)
 Too soon for refill.  Requested Prescriptions  Pending Prescriptions Disp Refills   PARoxetine  (PAXIL ) 10 MG tablet [Pharmacy Med Name: PAROXETINE  HCL 10 MG TABLET] 90 tablet 1    Sig: TAKE 1 TABLET BY MOUTH EVERY DAY     Psychiatry:  Antidepressants - SSRI Passed - 04/23/2024 11:30 AM      Passed - Valid encounter within last 6 months    Recent Outpatient Visits           2 weeks ago Routine general medical examination at a health care facility   [facility] [place], [place] P, DO   3 months ago Chronic fatigue   [place] [facility] [place], [place], DO   6 months ago Chronic fatigue   [place] [facility] [place], [place], DO

## 2024-06-02 ENCOUNTER — Other Ambulatory Visit: Payer: Self-pay | Admitting: Family Medicine

## 2024-06-03 NOTE — Telephone Encounter (Signed)
 Requested Prescriptions  Pending Prescriptions Disp Refills   omeprazole  (PRILOSEC) 20 MG capsule [Pharmacy Med Name: OMEPRAZOLE  DR 20 MG CAPSULE] 90 capsule 0    Sig: TAKE 1 CAPSULE BY MOUTH EVERY DAY     Gastroenterology: Proton Pump Inhibitors Passed - 06/03/2024  4:26 PM      Passed - Valid encounter within last 12 months    Recent Outpatient Visits           2 months ago Routine general medical examination at a health care facility   [facility], [place], DO   5 months ago Chronic fatigue   [place] [facility] [place], [place], DO   8 months ago Chronic fatigue   [place] [facility] [place], [place], DO

## 2024-10-04 ENCOUNTER — Ambulatory Visit: Admitting: Family Medicine
